# Patient Record
Sex: Male | Born: 1960 | Hispanic: No | Marital: Married | Smoking: Never smoker
Health system: Southern US, Community
[De-identification: ages and names within clinical notes are randomized; demographics above are authoritative.]

## PROBLEM LIST (undated history)

## (undated) DIAGNOSIS — I10 Essential (primary) hypertension: Secondary | ICD-10-CM

## (undated) DIAGNOSIS — E7800 Pure hypercholesterolemia, unspecified: Secondary | ICD-10-CM

## (undated) DIAGNOSIS — M549 Dorsalgia, unspecified: Secondary | ICD-10-CM

## (undated) DIAGNOSIS — G8929 Other chronic pain: Secondary | ICD-10-CM

## (undated) HISTORY — DX: Essential (primary) hypertension: I10

## (undated) HISTORY — PX: NO PAST SURGERIES: SHX2092

---

## 2007-06-26 ENCOUNTER — Ambulatory Visit: Payer: Self-pay | Admitting: *Deleted

## 2007-06-26 ENCOUNTER — Ambulatory Visit: Payer: Self-pay | Admitting: Internal Medicine

## 2007-12-15 ENCOUNTER — Ambulatory Visit: Payer: Self-pay | Admitting: Internal Medicine

## 2007-12-15 ENCOUNTER — Encounter (INDEPENDENT_AMBULATORY_CARE_PROVIDER_SITE_OTHER): Payer: Self-pay | Admitting: Adult Health

## 2007-12-15 LAB — CONVERTED CEMR LAB
Albumin: 4.4 g/dL (ref 3.5–5.2)
Alkaline Phosphatase: 96 units/L (ref 39–117)
BUN: 18 mg/dL (ref 6–23)
CO2: 20 meq/L (ref 19–32)
Cholesterol: 246 mg/dL — ABNORMAL HIGH (ref 0–200)
Eosinophils Absolute: 0.2 10*3/uL (ref 0.0–0.7)
Eosinophils Relative: 3 % (ref 0–5)
Glucose, Bld: 93 mg/dL (ref 70–99)
HCT: 45.6 % (ref 39.0–52.0)
HDL: 32 mg/dL — ABNORMAL LOW (ref >39)
Lymphocytes Relative: 32 % (ref 12–46)
Lymphs Abs: 2.8 10*3/uL (ref 0.7–4.0)
MCV: 84.3 fL (ref 78.0–100.0)
Monocytes Relative: 7 % (ref 3–12)
Neutrophils Relative %: 58 % (ref 43–77)
Platelets: 281 10*3/uL (ref 150–400)
Potassium: 3.9 meq/L (ref 3.5–5.3)
RBC: 5.41 M/uL (ref 4.22–5.81)
Sodium: 139 meq/L (ref 135–145)
Total Protein: 7.5 g/dL (ref 6.0–8.3)
Triglycerides: 674 mg/dL — ABNORMAL HIGH (ref <150)
WBC: 8.7 10*3/uL (ref 4.0–10.5)

## 2008-04-12 ENCOUNTER — Ambulatory Visit: Payer: Self-pay | Admitting: Internal Medicine

## 2008-04-12 ENCOUNTER — Encounter (INDEPENDENT_AMBULATORY_CARE_PROVIDER_SITE_OTHER): Payer: Self-pay | Admitting: Adult Health

## 2008-04-12 LAB — CONVERTED CEMR LAB
Albumin: 4.2 g/dL (ref 3.5–5.2)
Alkaline Phosphatase: 84 units/L (ref 39–117)
Basophils Absolute: 0.1 10*3/uL (ref 0.0–0.1)
CO2: 22 meq/L (ref 19–32)
Calcium: 9.3 mg/dL (ref 8.4–10.5)
Chloride: 107 meq/L (ref 96–112)
Eosinophils Relative: 2 % (ref 0–5)
Glucose, Bld: 107 mg/dL — ABNORMAL HIGH (ref 70–99)
HCT: 44.8 % (ref 39.0–52.0)
Hemoglobin: 15.3 g/dL (ref 13.0–17.0)
LDL Cholesterol: 147 mg/dL — ABNORMAL HIGH (ref 0–99)
Lymphocytes Relative: 35 % (ref 12–46)
Lymphs Abs: 2.7 10*3/uL (ref 0.7–4.0)
MCV: 84.1 fL (ref 78.0–100.0)
Monocytes Absolute: 0.6 10*3/uL (ref 0.1–1.0)
Monocytes Relative: 8 % (ref 3–12)
Neutro Abs: 4.2 10*3/uL (ref 1.7–7.7)
PSA: 2 ng/mL (ref 0.10–4.00)
Potassium: 4.2 meq/L (ref 3.5–5.3)
RBC: 5.33 M/uL (ref 4.22–5.81)
Sodium: 141 meq/L (ref 135–145)
Total Protein: 7.4 g/dL (ref 6.0–8.3)
Triglycerides: 174 mg/dL — ABNORMAL HIGH (ref <150)
Vit D, 25-Hydroxy: 12 ng/mL — ABNORMAL LOW (ref 30–89)
WBC: 7.7 10*3/uL (ref 4.0–10.5)

## 2008-04-24 ENCOUNTER — Encounter: Payer: Self-pay | Admitting: Internal Medicine

## 2008-04-24 ENCOUNTER — Ambulatory Visit: Payer: Self-pay | Admitting: Cardiology

## 2008-04-24 ENCOUNTER — Ambulatory Visit (HOSPITAL_COMMUNITY): Admission: RE | Admit: 2008-04-24 | Discharge: 2008-04-24 | Payer: Self-pay | Admitting: Internal Medicine

## 2008-06-20 ENCOUNTER — Ambulatory Visit: Payer: Self-pay | Admitting: Internal Medicine

## 2008-07-17 ENCOUNTER — Ambulatory Visit: Payer: Self-pay | Admitting: Internal Medicine

## 2008-10-04 ENCOUNTER — Ambulatory Visit: Payer: Self-pay | Admitting: Internal Medicine

## 2008-10-04 ENCOUNTER — Encounter (INDEPENDENT_AMBULATORY_CARE_PROVIDER_SITE_OTHER): Payer: Self-pay | Admitting: Adult Health

## 2008-10-04 LAB — CONVERTED CEMR LAB
AST: 16 units/L (ref 0–37)
Albumin: 4.4 g/dL (ref 3.5–5.2)
Alkaline Phosphatase: 82 units/L (ref 39–117)
Chloride: 107 meq/L (ref 96–112)
Glucose, Bld: 93 mg/dL (ref 70–99)
LDL Cholesterol: 147 mg/dL — ABNORMAL HIGH (ref 0–99)
Potassium: 4 meq/L (ref 3.5–5.3)
Sodium: 140 meq/L (ref 135–145)
Total Protein: 7.5 g/dL (ref 6.0–8.3)

## 2008-10-08 ENCOUNTER — Ambulatory Visit: Payer: Self-pay | Admitting: Internal Medicine

## 2008-12-25 ENCOUNTER — Encounter: Admission: RE | Admit: 2008-12-25 | Discharge: 2008-12-25 | Payer: Self-pay | Admitting: Infectious Diseases

## 2009-01-13 ENCOUNTER — Encounter (INDEPENDENT_AMBULATORY_CARE_PROVIDER_SITE_OTHER): Payer: Self-pay | Admitting: Adult Health

## 2009-01-13 ENCOUNTER — Ambulatory Visit: Payer: Self-pay | Admitting: Internal Medicine

## 2009-01-13 LAB — CONVERTED CEMR LAB
ALT: 13 units/L (ref 0–53)
Albumin: 4.9 g/dL (ref 3.5–5.2)
CO2: 22 meq/L (ref 19–32)
Calcium: 9.9 mg/dL (ref 8.4–10.5)
Chloride: 106 meq/L (ref 96–112)
Cholesterol: 209 mg/dL — ABNORMAL HIGH (ref 0–200)
Sodium: 140 meq/L (ref 135–145)
Total Protein: 7.9 g/dL (ref 6.0–8.3)

## 2009-04-02 ENCOUNTER — Encounter (INDEPENDENT_AMBULATORY_CARE_PROVIDER_SITE_OTHER): Payer: Self-pay | Admitting: Adult Health

## 2009-04-02 ENCOUNTER — Ambulatory Visit: Payer: Self-pay | Admitting: Internal Medicine

## 2009-04-02 LAB — CONVERTED CEMR LAB
AST: 17 units/L (ref 0–37)
Albumin: 4.5 g/dL (ref 3.5–5.2)
BUN: 15 mg/dL (ref 6–23)
Calcium: 9.7 mg/dL (ref 8.4–10.5)
Chloride: 104 meq/L (ref 96–112)
Glucose, Bld: 99 mg/dL (ref 70–99)
HDL: 45 mg/dL (ref >39)
Helicobacter Pylori Antibody-IgG: 0.7
Potassium: 4.1 meq/L (ref 3.5–5.3)
Triglycerides: 113 mg/dL (ref <150)

## 2009-06-03 ENCOUNTER — Ambulatory Visit: Payer: Self-pay | Admitting: Internal Medicine

## 2009-11-05 ENCOUNTER — Encounter (INDEPENDENT_AMBULATORY_CARE_PROVIDER_SITE_OTHER): Payer: Self-pay | Admitting: Internal Medicine

## 2009-11-05 LAB — CONVERTED CEMR LAB
HDL: 38 mg/dL — ABNORMAL LOW (ref >39)
Total CHOL/HDL Ratio: 5.3

## 2009-11-25 ENCOUNTER — Ambulatory Visit: Payer: Self-pay

## 2009-11-25 ENCOUNTER — Encounter: Payer: Self-pay | Admitting: Internal Medicine

## 2009-11-25 ENCOUNTER — Ambulatory Visit: Payer: Self-pay | Admitting: Cardiology

## 2009-11-25 ENCOUNTER — Ambulatory Visit (HOSPITAL_COMMUNITY): Admission: RE | Admit: 2009-11-25 | Discharge: 2009-11-25 | Payer: Self-pay | Admitting: Family Medicine

## 2009-11-25 ENCOUNTER — Encounter (INDEPENDENT_AMBULATORY_CARE_PROVIDER_SITE_OTHER): Payer: Self-pay | Admitting: Family Medicine

## 2009-11-25 ENCOUNTER — Encounter (INDEPENDENT_AMBULATORY_CARE_PROVIDER_SITE_OTHER): Payer: Self-pay

## 2010-03-10 NOTE — Miscellaneous (Signed)
Summary: IV for Dobutamine Echo  Clinical Lists Changes     IV 20 G (R) Hand for Dobutamine Echo. Patsy Edwards,RN.

## 2010-06-30 ENCOUNTER — Telehealth: Payer: Self-pay | Admitting: Cardiology

## 2010-06-30 NOTE — Telephone Encounter (Signed)
Faxed Echo. To [facility] ([phone]). This patient was referred patients from them.

## 2013-01-23 ENCOUNTER — Other Ambulatory Visit (HOSPITAL_COMMUNITY): Payer: Self-pay | Admitting: Internal Medicine

## 2013-01-23 ENCOUNTER — Ambulatory Visit (HOSPITAL_COMMUNITY)
Admission: RE | Admit: 2013-01-23 | Discharge: 2013-01-23 | Disposition: A | Discharge status: Home / Self Care | Payer: No Typology Code available for payment source | Source: Ambulatory Visit | Attending: Internal Medicine | Admitting: Internal Medicine

## 2013-01-23 DIAGNOSIS — M5137 Other intervertebral disc degeneration, lumbosacral region: Secondary | ICD-10-CM | POA: Insufficient documentation

## 2013-01-23 DIAGNOSIS — R52 Pain, unspecified: Secondary | ICD-10-CM

## 2013-01-23 DIAGNOSIS — R209 Unspecified disturbances of skin sensation: Secondary | ICD-10-CM | POA: Insufficient documentation

## 2013-01-23 DIAGNOSIS — M47817 Spondylosis without myelopathy or radiculopathy, lumbosacral region: Secondary | ICD-10-CM | POA: Insufficient documentation

## 2013-01-23 DIAGNOSIS — M51379 Other intervertebral disc degeneration, lumbosacral region without mention of lumbar back pain or lower extremity pain: Secondary | ICD-10-CM | POA: Insufficient documentation

## 2013-01-29 ENCOUNTER — Emergency Department (HOSPITAL_COMMUNITY)
Admission: EM | Admit: 2013-01-29 | Discharge: 2013-01-29 | Disposition: A | Discharge status: Home / Self Care | Payer: No Typology Code available for payment source | Source: Home / Self Care | Attending: Emergency Medicine | Admitting: Emergency Medicine

## 2013-01-29 ENCOUNTER — Encounter (HOSPITAL_COMMUNITY): Payer: Self-pay | Admitting: Emergency Medicine

## 2013-01-29 DIAGNOSIS — M5430 Sciatica, unspecified side: Secondary | ICD-10-CM

## 2013-01-29 DIAGNOSIS — M5432 Sciatica, left side: Secondary | ICD-10-CM

## 2013-01-29 HISTORY — DX: Dorsalgia, unspecified: M54.9

## 2013-01-29 HISTORY — DX: Pure hypercholesterolemia, unspecified: E78.00

## 2013-01-29 MED ORDER — METHOCARBAMOL 500 MG PO TABS: 500.0000 mg | ORAL_TABLET | Freq: Three times a day (TID) | ORAL | Status: DC

## 2013-01-29 MED ORDER — METHYLPREDNISOLONE ACETATE 80 MG/ML IJ SUSP
INTRAMUSCULAR | Status: AC
  Filled 2013-01-29: qty 1

## 2013-01-29 MED ORDER — METHYLPREDNISOLONE ACETATE 80 MG/ML IJ SUSP
80.0000 mg | Freq: Once | INTRAMUSCULAR | Status: AC
  Administered 2013-01-29: 80 mg via INTRAMUSCULAR

## 2013-01-29 MED ORDER — MELOXICAM 15 MG PO TABS: 15.0000 mg | ORAL_TABLET | Freq: Every day | ORAL | Status: DC

## 2013-01-29 MED ORDER — TRAMADOL HCL 50 MG PO TABS: 100.0000 mg | ORAL_TABLET | Freq: Three times a day (TID) | ORAL | Status: DC | PRN

## 2013-01-29 NOTE — ED Provider Notes (Signed)
Chief Complaint:   Chief Complaint  Patient presents with  . Leg Pain    History of Present Illness:   Zachary Hester is a 52 year old male who has had a two-week history of pain that begins in his left buttock and radiates down his entire leg into his foot. The pain is worse if he walks or sits. The leg has felt numb and tingly, but there's been no muscle weakness, bladder, or bowel dysfunction. No saddle anesthesia. He denies any fever, chills, or unintended weight loss. He has a history of degenerative disease in 2008. He was seen at the [facility] 4 days ago and had an x-ray of his back, but does not know the results. He was not given any treatment.  Review of Systems:  Other than noted above, the patient denies any of the following symptoms: Systemic:  No fever, chills, severe fatigue, or unexplained weight loss. GI:  No abdominal pain, nausea, vomiting, diarrhea, constipation, incontinence of bowel, or blood in stool. GU:  No dysuria, frequency, urgency, or hematuria. No incontinence of urine or difficulty urinating.  M-S:  No neck pain, joint pain, arthritis, or myalgias. Neuro:  No paresthesias, saddle anesthesia, muscular weakness, or progressive neurological deficit.  PMFSH:  Past medical history, family history, social history, meds, and allergies were reviewed. Specifically, there is no history of cancer, major trauma, osteoporosis, immunosuppression, or HIV infection. He has elevated cholesterol.  Physical Exam:   Vital signs:  BP 125/76  Pulse 79  Temp(Src) 97.5 F (36.4 C) (Oral)  Resp 16  SpO2 100% General:  Alert, oriented, in no distress. Abdomen:  Soft, non-tender.  No organomegaly or mass.  No pulsatile midline abdominal mass or bruit. Back:  Reveals no tenderness to palpation. His back has a fairly good range of motion, nearly full with some pain at the endpoints of movement. Straight leg raising was negative bilaterally. Neuro:  Normal  muscle strength, he reported diminished sensation over his left little toe to light touch, and knee and ankle reflexes were 0 bilaterally. Extremities: Pedal pulses were full, there was no edema. Skin:  Clear, warm and dry.  No rash.  Radiology:  Dg Lumbar Spine Complete  01/23/2013   CLINICAL DATA:  History of chronic back pain. History of injury from a fall several months ago. Numbness and tingling in left leg.  EXAM: LUMBAR SPINE - COMPLETE 4+ VIEW  COMPARISON:  None.  FINDINGS: There are 5 non-rib-bearing lumbar type vertebral bodies. SI joints appear intact. There is slight narrowing of the intervertebral disc space at the level of L5-S1. There is marginal osteophyte formation representing changes of degenerative spondylosis which is most severe at the level of L5-S1 with there is also some sclerosis. No fracture or bony destruction is seen. No subluxation or pars defects are evident.  IMPRESSION: Narrowing of intervertebral disc space at L5-S1. Changes of degenerative spondylosis most severe at the level of L5-S1.   Electronically Signed   By: Onalee Hua  Call M.D.   On: 01/23/2013 17:01    Course in Urgent Care Center:   Given Depo-Medrol 80 mg IM.  Assessment:  The encounter diagnosis was Sciatica, left.  He possibly has nerve root impingement from an HNP and we'll need to followup with orthopedics.  Plan:   1.  Meds:  The following meds were prescribed:   New Prescriptions   MELOXICAM (MOBIC) 15 MG TABLET    Take 1 tablet (15 mg total) by mouth daily.  METHOCARBAMOL (ROBAXIN) 500 MG TABLET    Take 1 tablet (500 mg total) by mouth 3 (three) times daily.   TRAMADOL (ULTRAM) 50 MG TABLET    Take 2 tablets (100 mg total) by mouth every 8 (eight) hours as needed.    2.  Patient Education/Counseling:  The patient was given appropriate handouts, self care instructions, and instructed in symptomatic relief. The patient was encouraged to try to be as active as possible and given some exercises to  do followed by moist heat.  3.  Follow up:  The patient was told to follow up if no better in 3 to 4 days, if becoming worse in any way, and given some red flag symptoms such as worsening pain or new neurological symptoms which would prompt immediate return.  Follow up with Dr. Isaias Cowman in 2 weeks.     [name], MD 01/29/13 [phone]

## 2013-01-29 NOTE — ED Notes (Signed)
Pt  Reports  Pain  Down  l  Leg    The  Pt  Reports  An old  inj  To the  l  Leg      10  Years  Ago      He  Ambulated  To  Room  With  A  Slow        Steady  Gait           He  Uses  A  Cane           He  denys  A  Recent  Injury

## 2013-02-14 ENCOUNTER — Emergency Department (INDEPENDENT_AMBULATORY_CARE_PROVIDER_SITE_OTHER)
Admission: EM | Admit: 2013-02-14 | Discharge: 2013-02-14 | Disposition: A | Discharge status: Home / Self Care | Payer: No Typology Code available for payment source | Source: Home / Self Care | Attending: Emergency Medicine | Admitting: Emergency Medicine

## 2013-02-14 ENCOUNTER — Encounter (HOSPITAL_COMMUNITY): Payer: Self-pay | Admitting: Emergency Medicine

## 2013-02-14 DIAGNOSIS — M5430 Sciatica, unspecified side: Secondary | ICD-10-CM

## 2013-02-14 DIAGNOSIS — M5432 Sciatica, left side: Secondary | ICD-10-CM

## 2013-02-14 MED ORDER — MELOXICAM 7.5 MG PO TABS: 7.5000 mg | ORAL_TABLET | Freq: Every day | ORAL | Status: DC

## 2013-02-14 NOTE — ED Provider Notes (Signed)
CSN: [id number]     Arrival date & time 02/14/13  1000 History   First MD Initiated Contact with Patient 02/14/13 1135     Chief Complaint  Patient presents with  . Leg Pain   (Consider location/radiation/quality/duration/timing/severity/associated sxs/prior Treatment) HPI Comments: Patient is here to discuss chronic left sided sciatica. This condition has been present since 2008. [facility] and prescribed mobic and tramadol. States mobic was helpful and tramadol made him nauseous, so he has not used this medication very often. Once he ran out of medication, symptoms returned, so he contacted the ortho referral listed on his paperwork for evaluation and was informed that he could not be seen because "they did not accept his orange card." So [facility] medication and additional referral.   The history is provided by the patient.    Past Medical History  Diagnosis Date  . Back pain   . Hypercholesteremia    History reviewed. No pertinent past surgical history. History reviewed. No pertinent family history. History  Substance Use Topics  . Smoking status: Never Smoker   . Smokeless tobacco: Not on file  . Alcohol Use: No    Review of Systems  All other systems reviewed and are negative.    Allergies  Review of patient's allergies indicates no known allergies.  Home Medications   Current Outpatient Rx  Name  Route  Sig  Dispense  Refill  . meloxicam (MOBIC) 7.5 MG tablet   Oral   Take 1 tablet (7.5 mg total) by mouth daily.   30 tablet   0   . traMADol (ULTRAM) 50 MG tablet   Oral   Take 2 tablets (100 mg total) by mouth every 8 (eight) hours as needed.   30 tablet   0    BP 140/85  Pulse 100  Temp(Src) 97.8 F (36.6 C) (Oral)  Resp 18  SpO2 100% Physical Exam  Nursing note and vitals reviewed. Constitutional: He is oriented to person, place, and time. He appears well-developed and well-nourished.  No distress.  HENT:  Head: Normocephalic and atraumatic.  Eyes: Conjunctivae are normal.  Neck: Normal range of motion. Neck supple.  Cardiovascular: Normal rate, regular rhythm and normal heart sounds.   Pulmonary/Chest: Effort normal and breath sounds normal. No respiratory distress. He has no wheezes.  Abdominal: Soft. There is no tenderness.  Musculoskeletal: Normal range of motion.       Legs: Neurological: He is alert and oriented to person, place, and time. He has normal strength and normal reflexes. No sensory deficit. Coordination and gait normal.  Skin: Skin is warm and dry. No rash noted.  Psychiatric: He has a normal mood and affect. His behavior is normal.    ED Course  Procedures (including critical care time) Labs Review Labs Reviewed - No data to display Imaging Review No results found.  EKG Interpretation    Date/Time:    Ventricular Rate:    PR Interval:    QRS Duration:   QT Interval:    QTC Calculation:   R Axis:     Text Interpretation:              MDM   1. Chronic sciatica of left side    Spent several minutes explaining to the patient that because of his health insurance coverage, a specialist referral needed to be arranged and approved through his PCP office. He states that he has made  an appointment with his PCP for 02/26/2013, but he does not want to wait until then. I explained to his that based upon his normal examination tonight, he would not require emergent or even urgent referral and I would be glad to provide him with additional Rx for mobic until he can see his PCP and specialist referral/evaluation can be arranged.     Jess Barters [place], [place] 02/14/13 1902

## 2013-02-14 NOTE — Discharge Instructions (Signed)
As we discussed, because of the type of health insurance coverage you have, all referrals to a specialist provider need to be arranged and approved by your primary care office [facility]). Please Use Mobic 7.5 mg once a day as prescribed until you are able to follow up with your primary care office on 02/26/2013.

## 2013-02-14 NOTE — ED Notes (Signed)
Pt  Reports  Flair  Up  Of  Pain  Down l  Leg  From   Old  Injury        Seen  sev  Weeks  Ago ucc  For  Similar

## 2013-02-17 NOTE — ED Provider Notes (Signed)
Medical screening examination/treatment/procedure(s) were performed by a resident physician or non-physician practitioner and as the supervising physician I was immediately available for consultation/collaboration.  Evan Corey, MD    Evan S Corey, MD 02/17/13 0848 

## 2013-02-26 ENCOUNTER — Ambulatory Visit: Payer: Self-pay | Admitting: Internal Medicine

## 2013-03-22 ENCOUNTER — Ambulatory Visit: Payer: No Typology Code available for payment source | Attending: Internal Medicine | Admitting: Physical Therapy

## 2013-03-22 DIAGNOSIS — R262 Difficulty in walking, not elsewhere classified: Secondary | ICD-10-CM | POA: Insufficient documentation

## 2013-03-22 DIAGNOSIS — E7800 Pure hypercholesterolemia, unspecified: Secondary | ICD-10-CM | POA: Insufficient documentation

## 2013-03-22 DIAGNOSIS — IMO0001 Reserved for inherently not codable concepts without codable children: Secondary | ICD-10-CM | POA: Insufficient documentation

## 2013-03-22 DIAGNOSIS — M545 Low back pain, unspecified: Secondary | ICD-10-CM | POA: Insufficient documentation

## 2013-03-30 ENCOUNTER — Ambulatory Visit: Payer: No Typology Code available for payment source | Admitting: Physical Therapy

## 2013-04-05 ENCOUNTER — Ambulatory Visit: Payer: No Typology Code available for payment source | Admitting: Physical Therapy

## 2013-04-16 ENCOUNTER — Ambulatory Visit: Payer: No Typology Code available for payment source | Attending: Internal Medicine | Admitting: Physical Therapy

## 2013-04-16 DIAGNOSIS — E7800 Pure hypercholesterolemia, unspecified: Secondary | ICD-10-CM | POA: Insufficient documentation

## 2013-04-16 DIAGNOSIS — R262 Difficulty in walking, not elsewhere classified: Secondary | ICD-10-CM | POA: Insufficient documentation

## 2013-04-16 DIAGNOSIS — M545 Low back pain, unspecified: Secondary | ICD-10-CM | POA: Insufficient documentation

## 2013-04-16 DIAGNOSIS — IMO0001 Reserved for inherently not codable concepts without codable children: Secondary | ICD-10-CM | POA: Insufficient documentation

## 2013-04-19 ENCOUNTER — Ambulatory Visit: Payer: No Typology Code available for payment source | Admitting: Physical Therapy

## 2013-04-23 ENCOUNTER — Ambulatory Visit: Payer: No Typology Code available for payment source | Admitting: Physical Therapy

## 2013-04-26 ENCOUNTER — Ambulatory Visit: Payer: No Typology Code available for payment source | Admitting: Physical Therapy

## 2013-04-30 ENCOUNTER — Ambulatory Visit: Payer: No Typology Code available for payment source | Admitting: Physical Therapy

## 2013-05-03 ENCOUNTER — Ambulatory Visit: Payer: No Typology Code available for payment source | Admitting: Physical Therapy

## 2013-05-07 ENCOUNTER — Ambulatory Visit: Payer: No Typology Code available for payment source | Admitting: Physical Therapy

## 2013-05-14 ENCOUNTER — Ambulatory Visit: Payer: No Typology Code available for payment source | Attending: Internal Medicine | Admitting: Physical Therapy

## 2013-05-14 DIAGNOSIS — M545 Low back pain, unspecified: Secondary | ICD-10-CM | POA: Insufficient documentation

## 2013-05-14 DIAGNOSIS — R262 Difficulty in walking, not elsewhere classified: Secondary | ICD-10-CM | POA: Insufficient documentation

## 2013-05-14 DIAGNOSIS — IMO0001 Reserved for inherently not codable concepts without codable children: Secondary | ICD-10-CM | POA: Insufficient documentation

## 2013-05-14 DIAGNOSIS — E7800 Pure hypercholesterolemia, unspecified: Secondary | ICD-10-CM | POA: Insufficient documentation

## 2013-05-17 ENCOUNTER — Ambulatory Visit: Payer: No Typology Code available for payment source | Admitting: Physical Therapy

## 2013-05-21 ENCOUNTER — Ambulatory Visit: Payer: No Typology Code available for payment source | Admitting: Physical Therapy

## 2013-05-24 ENCOUNTER — Ambulatory Visit: Payer: No Typology Code available for payment source | Admitting: Physical Therapy

## 2013-05-28 ENCOUNTER — Ambulatory Visit: Payer: No Typology Code available for payment source | Admitting: Physical Therapy

## 2013-05-31 ENCOUNTER — Ambulatory Visit: Payer: No Typology Code available for payment source | Admitting: Physical Therapy

## 2013-06-04 ENCOUNTER — Ambulatory Visit: Payer: No Typology Code available for payment source | Admitting: Physical Therapy

## 2013-06-07 ENCOUNTER — Ambulatory Visit: Payer: No Typology Code available for payment source | Admitting: Physical Therapy

## 2013-06-11 ENCOUNTER — Ambulatory Visit: Payer: No Typology Code available for payment source | Attending: Internal Medicine | Admitting: Physical Therapy

## 2013-06-11 DIAGNOSIS — E7800 Pure hypercholesterolemia, unspecified: Secondary | ICD-10-CM | POA: Insufficient documentation

## 2013-06-11 DIAGNOSIS — M545 Low back pain, unspecified: Secondary | ICD-10-CM | POA: Insufficient documentation

## 2013-06-11 DIAGNOSIS — R262 Difficulty in walking, not elsewhere classified: Secondary | ICD-10-CM | POA: Insufficient documentation

## 2013-06-11 DIAGNOSIS — IMO0001 Reserved for inherently not codable concepts without codable children: Secondary | ICD-10-CM | POA: Insufficient documentation

## 2013-06-14 ENCOUNTER — Ambulatory Visit: Payer: No Typology Code available for payment source | Admitting: Physical Therapy

## 2013-06-18 ENCOUNTER — Ambulatory Visit: Payer: No Typology Code available for payment source | Admitting: Physical Therapy

## 2014-01-24 ENCOUNTER — Emergency Department (HOSPITAL_COMMUNITY): Payer: No Typology Code available for payment source

## 2014-01-24 ENCOUNTER — Encounter (HOSPITAL_COMMUNITY): Payer: Self-pay | Admitting: Emergency Medicine

## 2014-01-24 ENCOUNTER — Emergency Department (HOSPITAL_COMMUNITY)
Admission: EM | Admit: 2014-01-24 | Discharge: 2014-01-24 | Disposition: A | Discharge status: Home / Self Care | Payer: Self-pay | Attending: Emergency Medicine | Admitting: Emergency Medicine

## 2014-01-24 DIAGNOSIS — K529 Noninfective gastroenteritis and colitis, unspecified: Secondary | ICD-10-CM | POA: Insufficient documentation

## 2014-01-24 DIAGNOSIS — Z791 Long term (current) use of non-steroidal anti-inflammatories (NSAID): Secondary | ICD-10-CM | POA: Insufficient documentation

## 2014-01-24 DIAGNOSIS — E780 Pure hypercholesterolemia: Secondary | ICD-10-CM | POA: Insufficient documentation

## 2014-01-24 DIAGNOSIS — Z79899 Other long term (current) drug therapy: Secondary | ICD-10-CM | POA: Insufficient documentation

## 2014-01-24 LAB — COMPREHENSIVE METABOLIC PANEL
ALT: 15 U/L (ref 0–53)
AST: 17 U/L (ref 0–37)
Albumin: 3.9 g/dL (ref 3.5–5.2)
Alkaline Phosphatase: 101 U/L (ref 39–117)
Anion gap: 10 (ref 5–15)
BUN: 11 mg/dL (ref 6–23)
CO2: 24 mEq/L (ref 19–32)
Calcium: 9.2 mg/dL (ref 8.4–10.5)
Chloride: 105 mEq/L (ref 96–112)
Creatinine, Ser: 0.7 mg/dL (ref 0.50–1.35)
GFR calc Af Amer: >90 mL/min (ref >90)
GFR calc non Af Amer: >90 mL/min (ref >90)
Glucose, Bld: 108 mg/dL — ABNORMAL HIGH (ref 70–99)
Potassium: 4.2 mEq/L (ref 3.7–5.3)
Sodium: 139 mEq/L (ref 137–147)
Total Bilirubin: 0.4 mg/dL (ref 0.3–1.2)
Total Protein: 7.4 g/dL (ref 6.0–8.3)

## 2014-01-24 LAB — CBC WITH DIFFERENTIAL/PLATELET
Basophils Absolute: 0.1 10*3/uL (ref 0.0–0.1)
Basophils Relative: 1 % (ref 0–1)
Eosinophils Absolute: 0.2 10*3/uL (ref 0.0–0.7)
Eosinophils Relative: 3 % (ref 0–5)
HCT: 44.8 % (ref 39.0–52.0)
Hemoglobin: 15.4 g/dL (ref 13.0–17.0)
Lymphocytes Relative: 30 % (ref 12–46)
Lymphs Abs: 1.6 10*3/uL (ref 0.7–4.0)
MCH: 28.6 pg (ref 26.0–34.0)
MCHC: 34.4 g/dL (ref 30.0–36.0)
MCV: 83.3 fL (ref 78.0–100.0)
Monocytes Absolute: 0.4 10*3/uL (ref 0.1–1.0)
Monocytes Relative: 7 % (ref 3–12)
Neutro Abs: 3.2 10*3/uL (ref 1.7–7.7)
Neutrophils Relative %: 59 % (ref 43–77)
Platelets: 228 10*3/uL (ref 150–400)
RBC: 5.38 MIL/uL (ref 4.22–5.81)
RDW: 12.9 % (ref 11.5–15.5)
WBC: 5.3 10*3/uL (ref 4.0–10.5)

## 2014-01-24 LAB — URINALYSIS, ROUTINE W REFLEX MICROSCOPIC
Bilirubin Urine: NEGATIVE
Glucose, UA: NEGATIVE mg/dL
Hgb urine dipstick: NEGATIVE
Ketones, ur: NEGATIVE mg/dL
Nitrite: NEGATIVE
Protein, ur: NEGATIVE mg/dL
Specific Gravity, Urine: 1.023 (ref 1.005–1.030)
Urobilinogen, UA: 0.2 mg/dL (ref 0.0–1.0)
pH: 6.5 (ref 5.0–8.0)

## 2014-01-24 LAB — LIPASE, BLOOD: Lipase: 18 U/L (ref 11–59)

## 2014-01-24 LAB — URINE MICROSCOPIC-ADD ON

## 2014-01-24 MED ORDER — SODIUM CHLORIDE 0.9 % IV BOLUS (SEPSIS)
1000.0000 mL | Freq: Once | INTRAVENOUS | Status: AC
  Administered 2014-01-24: 1000 mL via INTRAVENOUS

## 2014-01-24 MED ORDER — MORPHINE SULFATE 4 MG/ML IJ SOLN
6.0000 mg | Freq: Once | INTRAMUSCULAR | Status: AC
  Administered 2014-01-24: 6 mg via INTRAVENOUS
  Filled 2014-01-24: qty 2

## 2014-01-24 MED ORDER — HYDROCODONE-ACETAMINOPHEN 5-325 MG PO TABS: 1.0000 | ORAL_TABLET | Freq: Four times a day (QID) | ORAL | Status: DC | PRN

## 2014-01-24 MED ORDER — PREDNISONE 50 MG PO TABS: 50.0000 mg | ORAL_TABLET | Freq: Every day | ORAL | Status: DC

## 2014-01-24 MED ORDER — IOHEXOL 300 MG/ML  SOLN
25.0000 mL | Freq: Once | INTRAMUSCULAR | Status: AC | PRN
  Administered 2014-01-24: 25 mL via ORAL

## 2014-01-24 MED ORDER — METRONIDAZOLE 500 MG PO TABS: 500.0000 mg | ORAL_TABLET | Freq: Two times a day (BID) | ORAL | Status: DC

## 2014-01-24 MED ORDER — IOHEXOL 300 MG/ML  SOLN
100.0000 mL | Freq: Once | INTRAMUSCULAR | Status: AC | PRN
  Administered 2014-01-24: 100 mL via INTRAVENOUS

## 2014-01-24 MED ORDER — CIPROFLOXACIN HCL 500 MG PO TABS: 500.0000 mg | ORAL_TABLET | Freq: Two times a day (BID) | ORAL | Status: DC

## 2014-01-24 NOTE — Discharge Instructions (Signed)
Return here as needed.  Follow up with GI specialist provided.  Increase your fluid intake, rest as much as possible

## 2014-01-24 NOTE — ED Provider Notes (Signed)
CSN: [id number]     Arrival date & time 01/24/14  0840 History   First MD Initiated Contact with Patient 01/24/14 [phone]     Chief Complaint  Patient presents with  . Headache  . Abdominal Pain  . eye symptoms      (Consider location/radiation/quality/duration/timing/severity/associated sxs/prior Treatment) HPI Patient presents to the emergency department with abdominal pain that started 1 week ago.  The patient states the pain has gotten worse.  She is also complaining of left neck pain and intermittent sharp stabbing pains in his left chest.  The patient states that he took Ultram without relief of his symptoms.  The patient states abdominal pain has increased over this timeframe.  Patient denies nausea, vomiting, shortness of breath, diarrhea, weakness, dizziness, back pain, fever, cough, runny nose, sore throat, lightheadedness, near syncope, rash, or syncope.  The patient states that his appetite has not been affected by the abdominal pain.  The patient states that nothing seems make his condition, better with palpation makes the pain worse.  The patient states that he attempted to follow-up with his primary care doctor is unable to get an appointment Past Medical History  Diagnosis Date  . Back pain   . Hypercholesteremia    History reviewed. No pertinent past surgical history. No family history on file. History  Substance Use Topics  . Smoking status: Never Smoker   . Smokeless tobacco: Not on file  . Alcohol Use: No    Review of Systems  All other systems negative except as documented in the HPI. All pertinent positives and negatives as reviewed in the HPI.  Allergies  Review of patient's allergies indicates no known allergies.  Home Medications   Prior to Admission medications   Medication Sig Start Date End Date Taking? Authorizing Provider  atorvastatin (LIPITOR) 20 MG tablet Take 20 mg by mouth daily.   Yes Historical Provider, MD  ibuprofen (ADVIL,MOTRIN) 400 MG  tablet Take 400 mg by mouth every 6 (six) hours as needed for headache or mild pain.   Yes Historical Provider, MD  traMADol (ULTRAM) 50 MG tablet Take 2 tablets (100 mg total) by mouth every 8 (eight) hours as needed. 01/29/13  Yes Reuben Likesavid C Keller, MD  meloxicam (MOBIC) 7.5 MG tablet Take 1 tablet (7.5 mg total) by mouth daily. Patient not taking: Reported on 01/24/2014 02/14/13   Jess BartersJennifer Lee H Presson, PA   BP 145/91 mmHg  Pulse 69  Temp(Src) 97.8 F (36.6 C) (Oral)  Resp 20  Ht 6\' 3"  (1.905 m)  Wt 196 lb (88.905 kg)  BMI 24.50 kg/m2  SpO2 100% Physical Exam  Constitutional: He is oriented to person, place, and time. He appears well-developed and well-nourished. No distress.  HENT:  Head: Normocephalic and atraumatic.  Mouth/Throat: Oropharynx is clear and moist.  Eyes: Pupils are equal, round, and reactive to light.  Neck: Normal range of motion. Neck supple.  Cardiovascular: Normal rate, regular rhythm and normal heart sounds.  Exam reveals no gallop and no friction rub.   No murmur heard. Pulmonary/Chest: Effort normal and breath sounds normal. No respiratory distress.  Abdominal: Soft. Bowel sounds are normal. He exhibits no distension. There is tenderness. There is no rebound and no guarding.  Musculoskeletal: He exhibits no edema.  Neurological: He is alert and oriented to person, place, and time. He exhibits normal muscle tone. Coordination normal.  Skin: Skin is warm and dry. No rash noted. No erythema.  Psychiatric: He has a normal mood and affect.  His behavior is normal.  Nursing note and vitals reviewed.   ED Course  Procedures (including critical care time) Labs Review Labs Reviewed  COMPREHENSIVE METABOLIC PANEL - Abnormal; Notable for the following:    Glucose, Bld 108 (*)    All other components within normal limits  URINE CULTURE  LIPASE, BLOOD  CBC WITH DIFFERENTIAL  URINALYSIS, ROUTINE W REFLEX MICROSCOPIC    Imaging Review Ct Abdomen Pelvis W  Contrast  01/24/2014   CLINICAL DATA:  Lower abdominal pain  EXAM: CT ABDOMEN AND PELVIS WITH CONTRAST  TECHNIQUE: Multidetector CT imaging of the abdomen and pelvis was performed using the standard protocol following bolus administration of intravenous contrast. Oral contrast was also administered.  CONTRAST:  100mL OMNIPAQUE IOHEXOL 300 MG/ML  SOLN  COMPARISON:  None.  FINDINGS: Lung bases are clear.  There is a 5 mm cyst in the dome of the liver on the right. No other focal liver lesion identified. There is some fatty infiltration near the fissure for the ligamentum teres. Gallbladder wall is not thickened. There is no biliary duct dilatation.  Spleen, pancreas, and adrenals appear normal. Kidneys bilaterally show no mass or hydronephrosis on either side. There are 2 renal arteries on the left, both widely patent. There is a single renal artery on the right, patent. There is no renal or ureteral calculus on either side.  In the pelvis, the urinary bladder is midline with normal wall thickness. There is no appreciable pelvic mass or pelvic fluid collection. The appendix appears normal.  There is moderate stool in the colon. There is a short segment of slight wall thickening in the mid sigmoid colon. No diverticular changes noted in this region. There is no bowel obstruction. No free air or portal venous air. There is no appreciable ascites, adenopathy, or abscess in the abdomen or pelvis. There is no abdominal aortic aneurysm. There is degenerative change in the lumbar spine. 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 Somewhat similar changes are present at L5-S1. There is extensive disc degeneration at L4-5 and L5-S1. No blastic or lytic bone lesions are appreciated.  IMPRESSION: Suspect a degree of focal colitis in the mid sigmoid region. Specifically, there is a short segment of mild wall thickening in this area. There is  subtle hyperemia in this area as well. There is no surrounding mesenteric thickening, and no diverticulitis.  No bowel obstruction.  No abscess.  Appendix appears normal.  No renal or ureteral calculus.  No hydronephrosis.  Spinal stenosis at L4-5 and to a slightly lesser degree at L5-S1, multifactorial.  Fatty infiltration in liver.   Electronically Signed   By: Bretta BangWilliam  Woodruff M.D.   On: 01/24/2014 13:01    The patient is advised of the results and he will be advised follow-up with the GI Dr. provided.  The patient is advised to increase his fluid intake and rest as much as possible.  Told to return here as needed    MDM   Final diagnoses:  None     Carlyle DollyChristopher W Chyler Creely, PA-C 01/24/14 1448  Mirian MoMatthew Gentry, MD 01/28/14 [phone]

## 2014-01-24 NOTE — ED Notes (Signed)
Patient states he started having L sided headache yesterday at 0700.   Patient states L eye red, with pain also.  Denies vision changes.  Patient also has lower abdominal pain but denies N/V/D.

## 2014-01-24 NOTE — ED Notes (Signed)
Pt reports intermittent chest pain daily accompanied with sweating and generalized weakness.

## 2014-01-24 NOTE — ED Notes (Signed)
Pt comfortable with discharge and follow up instructions. Pt declines wheelchair, escorted to waiting area by this RN. 

## 2014-01-25 LAB — URINE CULTURE
Colony Count: NO GROWTH
Culture: NO GROWTH

## 2014-01-25 NOTE — ED Notes (Signed)
This RN witnessed Marga HootsNicole Stephens RN waste 2mg /0.305mL Morphine into the sharps container.

## 2014-09-24 ENCOUNTER — Ambulatory Visit: Payer: No Typology Code available for payment source | Admitting: Family Medicine

## 2014-11-10 ENCOUNTER — Encounter (HOSPITAL_COMMUNITY): Payer: Self-pay | Admitting: *Deleted

## 2014-11-10 ENCOUNTER — Emergency Department (HOSPITAL_COMMUNITY)
Admission: EM | Admit: 2014-11-10 | Discharge: 2014-11-10 | Disposition: A | Discharge status: Home / Self Care | Payer: No Typology Code available for payment source | Attending: Emergency Medicine | Admitting: Emergency Medicine

## 2014-11-10 DIAGNOSIS — G8929 Other chronic pain: Secondary | ICD-10-CM | POA: Insufficient documentation

## 2014-11-10 DIAGNOSIS — M549 Dorsalgia, unspecified: Secondary | ICD-10-CM

## 2014-11-10 DIAGNOSIS — R51 Headache: Secondary | ICD-10-CM | POA: Insufficient documentation

## 2014-11-10 DIAGNOSIS — Z79899 Other long term (current) drug therapy: Secondary | ICD-10-CM | POA: Insufficient documentation

## 2014-11-10 DIAGNOSIS — E7800 Pure hypercholesterolemia, unspecified: Secondary | ICD-10-CM | POA: Insufficient documentation

## 2014-11-10 DIAGNOSIS — R21 Rash and other nonspecific skin eruption: Secondary | ICD-10-CM | POA: Insufficient documentation

## 2014-11-10 HISTORY — DX: Dorsalgia, unspecified: M54.9

## 2014-11-10 HISTORY — DX: Other chronic pain: G89.29

## 2014-11-10 MED ORDER — FAMOTIDINE 20 MG PO TABS
20.0000 mg | ORAL_TABLET | Freq: Once | ORAL | Status: AC
Start: 2014-11-10 — End: 2014-11-10
  Administered 2014-11-10: 20 mg via ORAL
  Filled 2014-11-10: qty 1

## 2014-11-10 MED ORDER — PREDNISONE 20 MG PO TABS
60.0000 mg | ORAL_TABLET | Freq: Once | ORAL | Status: AC
  Administered 2014-11-10: 60 mg via ORAL
  Filled 2014-11-10: qty 3

## 2014-11-10 MED ORDER — TRIAMCINOLONE ACETONIDE 0.025 % EX OINT: 1.0000 "application " | TOPICAL_OINTMENT | Freq: Two times a day (BID) | CUTANEOUS | Status: DC

## 2014-11-10 MED ORDER — DIPHENHYDRAMINE HCL 25 MG PO CAPS
25.0000 mg | ORAL_CAPSULE | Freq: Once | ORAL | Status: AC
  Administered 2014-11-10: 25 mg via ORAL
  Filled 2014-11-10: qty 1

## 2014-11-10 MED ORDER — PREDNISONE 10 MG (21) PO TBPK: 10.0000 mg | ORAL_TABLET | Freq: Every day | ORAL | Status: DC

## 2014-11-10 MED ORDER — HYDROXYZINE HCL 25 MG PO TABS: 25.0000 mg | ORAL_TABLET | Freq: Four times a day (QID) | ORAL | Status: DC

## 2014-11-10 MED ORDER — METOCLOPRAMIDE HCL 10 MG PO TABS
10.0000 mg | ORAL_TABLET | Freq: Once | ORAL | Status: AC
  Administered 2014-11-10: 10 mg via ORAL
  Filled 2014-11-10: qty 1

## 2014-11-10 NOTE — ED Notes (Signed)
PT reports rash wuth itching to BIL. Arms and groin area.

## 2014-11-10 NOTE — Discharge Instructions (Signed)

## 2014-11-10 NOTE — ED Notes (Signed)
Declined W/C at D/C and was escorted to lobby by RN. 

## 2014-11-10 NOTE — ED Provider Notes (Signed)
CSN: [id number]     Arrival date & time 11/10/14  4098 History  By signing my name below, I, Zachary Hester, attest that this documentation has been prepared under the direction and in the presence of  Zachary Berry, PA-C. Electronically Signed: [name], ED Scribe. 11/10/2014. 10:42 AM.   Chief Complaint  Patient presents with  . Rash   The history is provided by the patient. No language interpreter was used.   HPI Comments: Zachary Hester is a 54 y.o. male who presents to the Emergency Department complaining of a constant, pruritic rash to ventral aspect of BUE and left ear that has been present for 1 week with pruritic qualitiy progressively worsening last night. Rash is worse on right forearm. He has been taking 2 benadryl q2h with no relief; last dose was 6 hours ago. Pt reports a headache last night and this morning for which he took tramadol with mild relief. Denies recent changes to medication, new soaps or detergent, new foods, wheezing, sore throat, SOB, trouble swallowing. Also denies a PMhx of asthma, eczema, or similar rashes, fevers or chills.   Past Medical History  Diagnosis Date  . Back pain   . Hypercholesteremia   . Chronic back pain    History reviewed. No pertinent past surgical history. History reviewed. No pertinent family history. Social History  Substance Use Topics  . Smoking status: Never Smoker   . Smokeless tobacco: None  . Alcohol Use: No    Review of Systems  Constitutional: Negative.  Negative for fever and chills.  HENT: Negative for facial swelling, sore throat, trouble swallowing and voice change.   Respiratory: Negative for cough, choking, chest tightness, shortness of breath, wheezing and stridor.   Cardiovascular: Negative for chest pain.  Gastrointestinal: Negative for nausea, vomiting and abdominal pain.  Musculoskeletal: Negative for myalgias and arthralgias.  Skin: Positive for rash.  Neurological: Positive for headaches.    Allergies  Review of patient's allergies indicates no known allergies.  Home Medications   Prior to Admission medications   Medication Sig Start Date End Date Taking? Authorizing Provider  atorvastatin (LIPITOR) 20 MG tablet Take 20 mg by mouth daily.    Historical Provider, MD  ciprofloxacin (CIPRO) 500 MG tablet Take 1 tablet (500 mg total) by mouth 2 (two) times daily. 01/24/14   Charlestine Night, PA-C  HYDROcodone-acetaminophen (NORCO/VICODIN) 5-325 MG per tablet Take 1 tablet by mouth every 6 (six) hours as needed for moderate pain. 01/24/14   Charlestine Night, PA-C  hydrOXYzine (ATARAX/VISTARIL) 25 MG tablet Take 1 tablet (25 mg total) by mouth every 6 (six) hours. 11/10/14   Zachary Berry, PA-C  ibuprofen (ADVIL,MOTRIN) 400 MG tablet Take 400 mg by mouth every 6 (six) hours as needed for headache or mild pain.    Historical Provider, MD  meloxicam (MOBIC) 7.5 MG tablet Take 1 tablet (7.5 mg total) by mouth daily. Patient not taking: Reported on 01/24/2014 02/14/13   [name], PA  metroNIDAZOLE (FLAGYL) 500 MG tablet Take 1 tablet (500 mg total) by mouth 2 (two) times daily. 01/24/14   Charlestine Night, PA-C  predniSONE (STERAPRED UNI-PAK 21 TAB) 10 MG (21) TBPK tablet Take 1 tablet (10 mg total) by mouth daily. Take 6 tabs by mouth daily  for 2 days, then 5 tabs for 2 days, then 4 tabs for 2 days, then 3 tabs for 2 days, 2 tabs for 2 days, then 1 tab by mouth daily for 2 days 11/10/14  Zachary Berry, PA-C  traMADol (ULTRAM) 50 MG tablet Take 2 tablets (100 mg total) by mouth every 8 (eight) hours as needed. 01/29/13   [name], MD  triamcinolone (KENALOG) 0.025 % ointment Apply 1 application topically 2 (two) times daily. 11/10/14   Zachary Berry, PA-C   BP 136/86 mmHg  Pulse 94  Temp(Src) 98 F (36.7 C) (Oral)  Resp 18  Ht  (1.88 m)  Wt 206 lb (93.441 kg)  BMI 26.44 kg/m2  SpO2 100% Physical Exam  Constitutional: He is oriented to person, place, and time.  He appears well-developed and well-nourished. No distress.  HENT:  Head: Normocephalic.  Right Ear: External ear normal.  Left Ear: External ear normal.  Mouth/Throat: Oropharynx is clear and moist. No oropharyngeal exudate.  Eyes: Conjunctivae are normal. Right eye exhibits no discharge. Left eye exhibits no discharge.  Neck: Normal range of motion. Neck supple.  Cardiovascular: Normal rate, regular rhythm and normal heart sounds.  Exam reveals no gallop and no friction rub.   No murmur heard. Pulmonary/Chest: Effort normal and breath sounds normal. No respiratory distress. He has no wheezes. He has no rales.  Musculoskeletal: Normal range of motion.  Neurological: He is alert and oriented to person, place, and time. Coordination normal.  Skin: Skin is warm. Rash noted. There is erythema.  Diffuse macular, papular rash with erythema that is worse to [facility] joint on right forearm and along forearms with excoriations; thick areas behind bilateral ears that are dry along pinna.   Psychiatric: He has a normal mood and affect. His behavior is normal.  Nursing note and vitals reviewed.   ED Course  Procedures  DIAGNOSTIC STUDIES: Oxygen Saturation is 100% on RA, normal by my interpretation.    COORDINATION OF CARE: 10:40 AM Discussed treatment plan with pt at bedside and pt agreed to plan.   MDM   Final diagnoses:  Rash and nonspecific skin eruption    Rash consistent with atopic dermatitis. Patient denies any difficulty breathing or swallowing.  Pt has a patent airway without stridor and is handling secretions without difficulty; no angioedema. No blisters, no pustules, no warmth, no draining sinus tracts, no superficial abscesses, no bullous impetigo, no vesicles, no desquamation, no target lesions with dusky purpura or a central bulla. Not tender to touch. No concern for superimposed infection. No concern for SJS, TEN, TSS, tick borne illness, syphilis or other life-threatening condition.  Pepcid, benadryl and prednisone given in ED.  Will discharge home with short course of steroids, hydroxyzine as needed for pruritis, and topical steroid given as well.    I personally performed the services described in this documentation, which was scribed in my presence. The recorded information has been reviewed and is accurate.    Zachary Berry, PA-C 11/12/14 1129  Gwyneth Sprout, MD 11/14/14 [phone]

## 2014-11-18 ENCOUNTER — Encounter: Payer: Self-pay | Admitting: Gastroenterology

## 2015-01-07 ENCOUNTER — Encounter: Payer: Self-pay | Admitting: Nurse Practitioner

## 2015-01-20 ENCOUNTER — Encounter: Payer: No Typology Code available for payment source | Admitting: Gastroenterology

## 2015-11-25 IMAGING — CT CT ABD-PELV W/ CM
2 of 5 series · 15 of 46 positions shown, 17 images · IV contrast (omnipaque)
Comparison: None.

CLINICAL DATA: Lower abdominal pain

EXAM:
CT ABDOMEN AND PELVIS WITH CONTRAST
TECHNIQUE: Multidetector CT imaging of the abdomen and pelvis was performed
using the standard protocol following bolus administration of
intravenous contrast. Oral contrast was also administered.
CONTRAST:  100mL OMNIPAQUE IOHEXOL 300 MG/ML  SOLN

[Series 2: abd/ pelvis 5.0 i30f 1 · axial · 0.79mm/px · z∈[+850,+1305]mm · 12 of 103 slices shown, 14 images]
[im 6/103  soft-tissue]
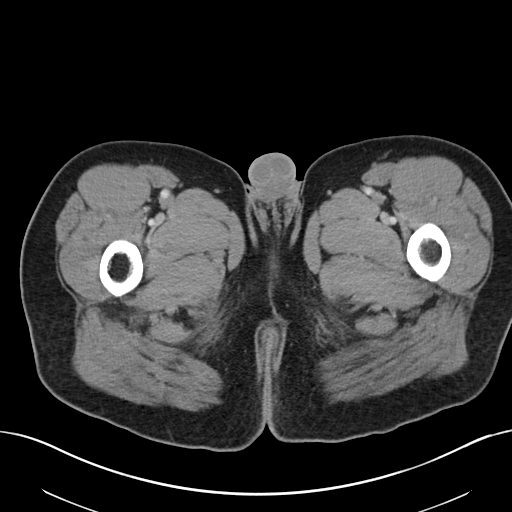
[im 6/103  bone]
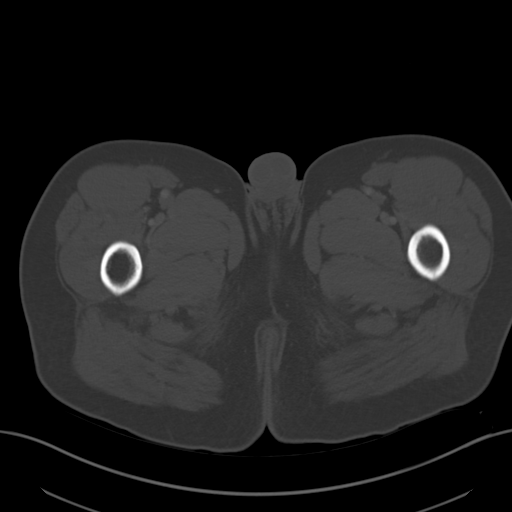
[im 18/103  soft-tissue]
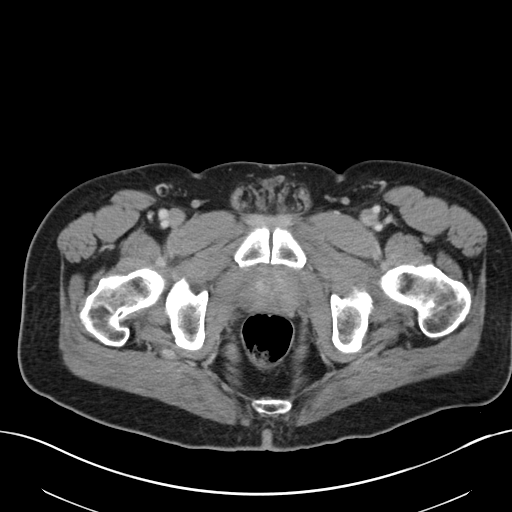
[im 23/103  soft-tissue]
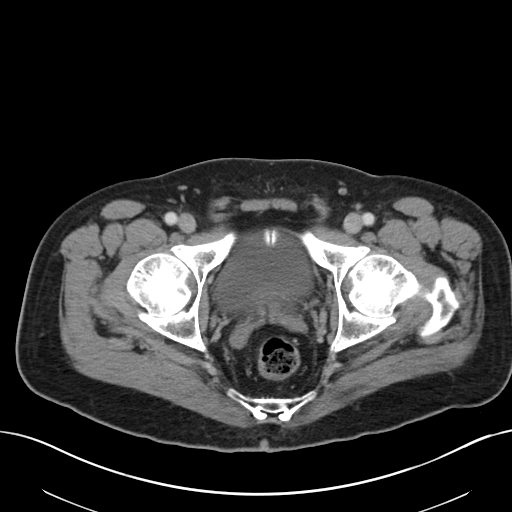
[im 29/103  soft-tissue]
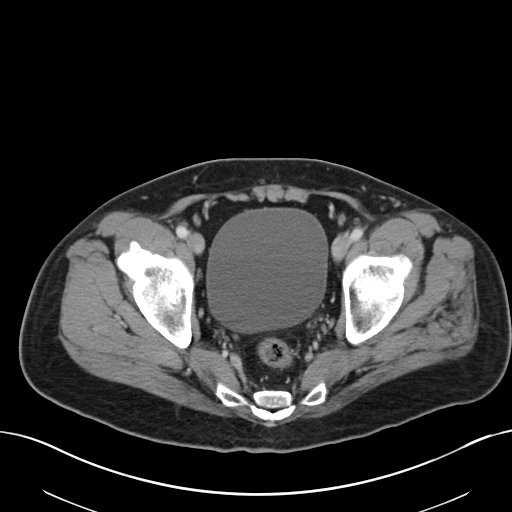
[im 40/103  soft-tissue]
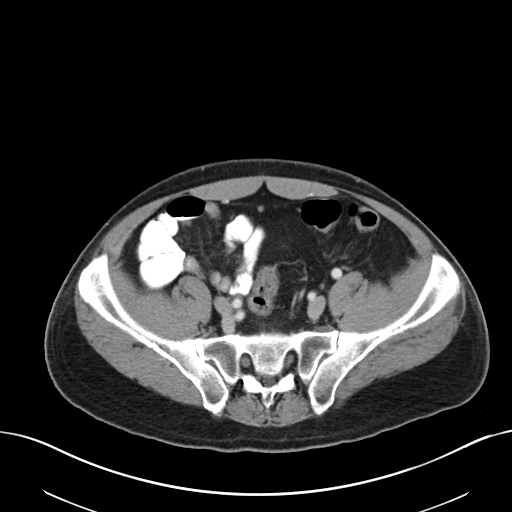
[im 46/103  soft-tissue]
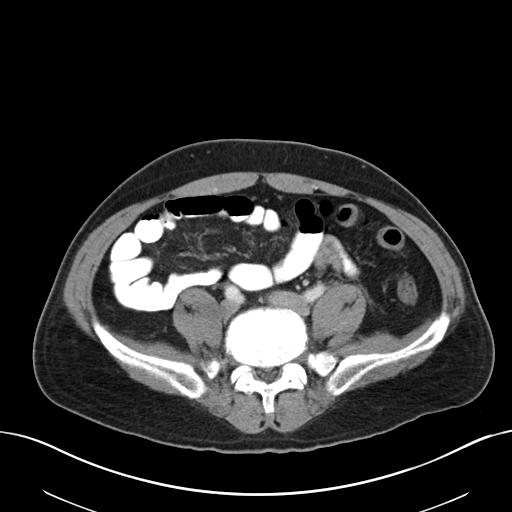
[im 57/103  soft-tissue]
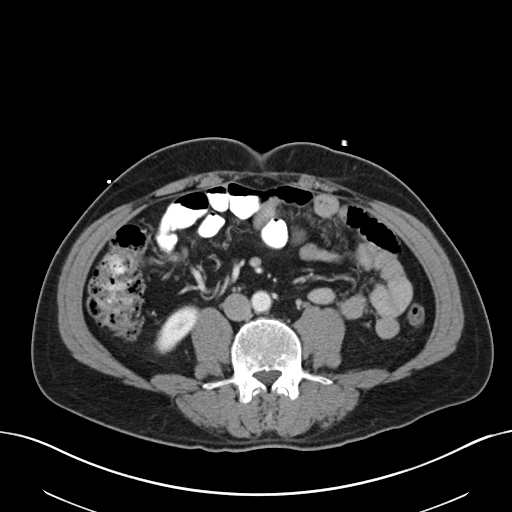
[im 63/103  soft-tissue]
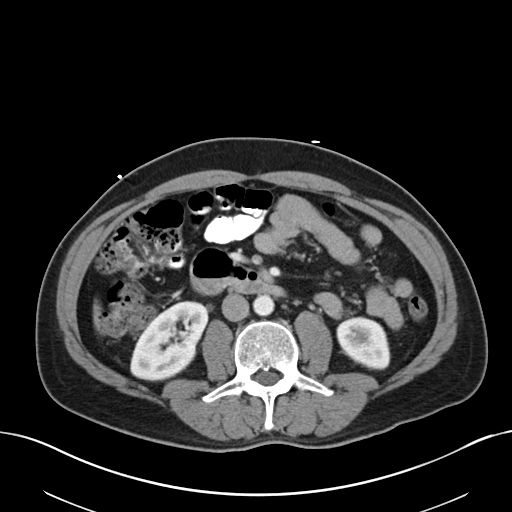
[im 74/103  soft-tissue]
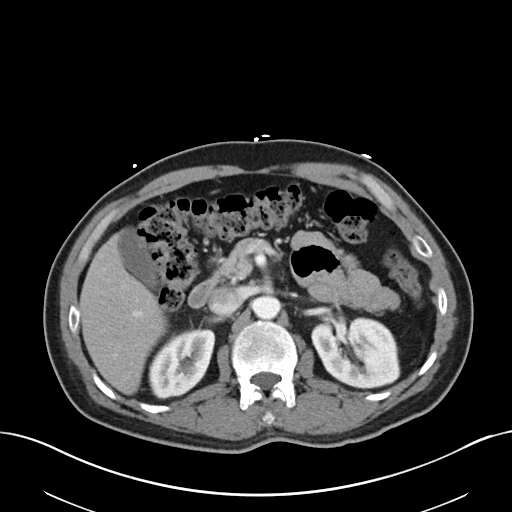
[im 74/103  bone]
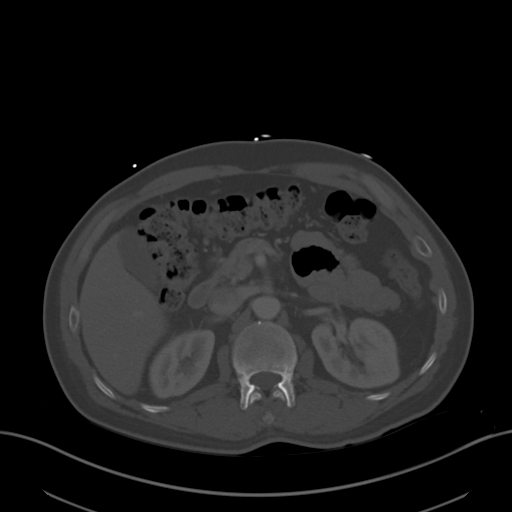
[im 80/103  soft-tissue]
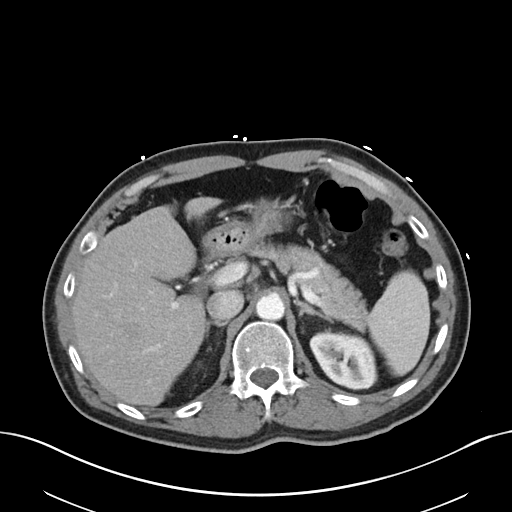
[im 86/103  soft-tissue]
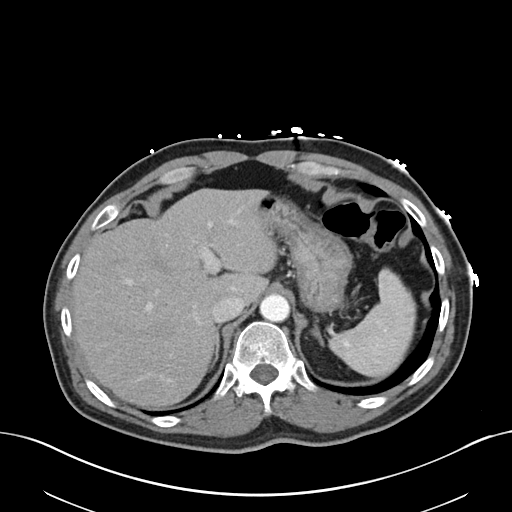
[im 97/103  soft-tissue]
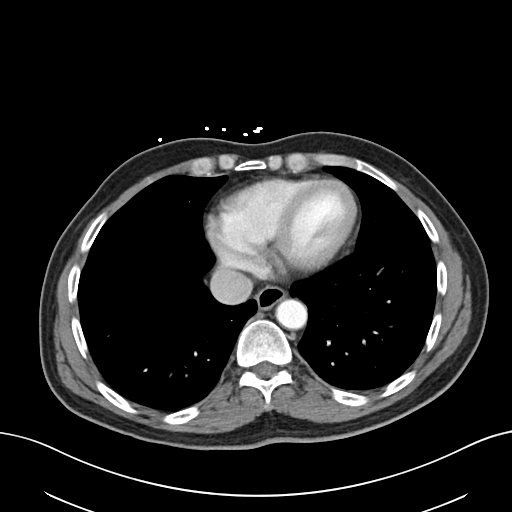

[Series 5: coronal soft tissue · coronal · 0.81mm/px · 3 of 83 slices shown]
[im 28/83  soft-tissue]
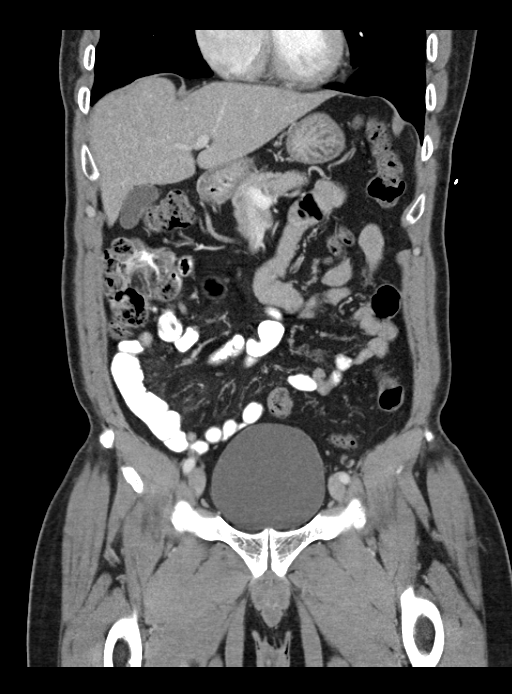
[im 37/83  soft-tissue]
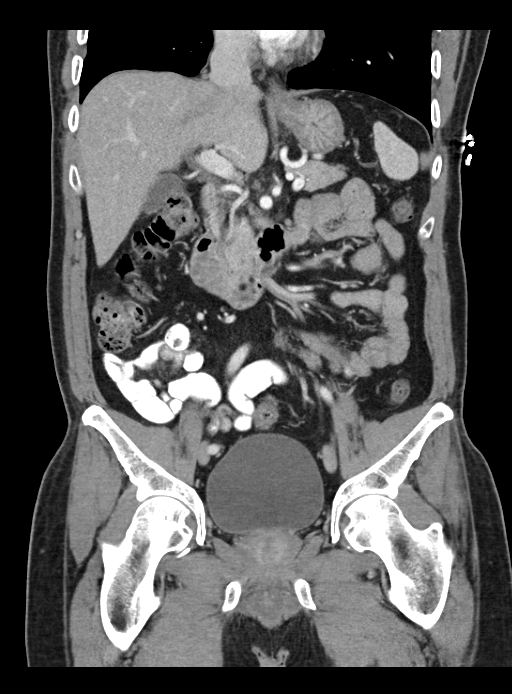
[im 46/83  soft-tissue]
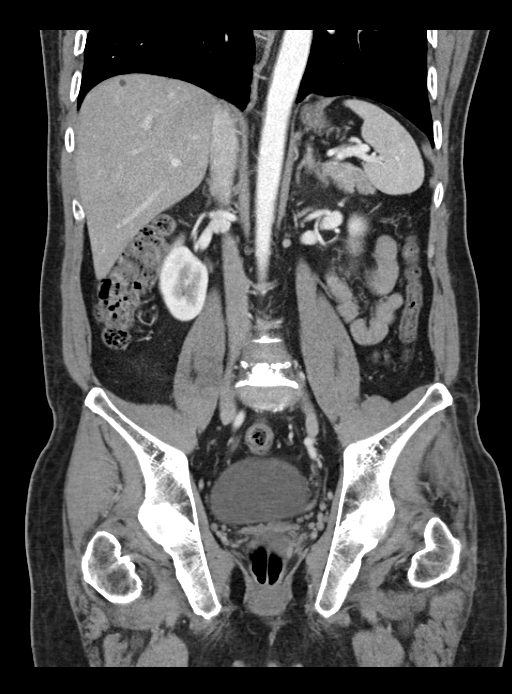

[15 of 46 positions shown; findings below may reference images not displayed]

FINDINGS: Lung bases are clear.

There is a 5 mm cyst in the dome of the liver on the right. No other
focal liver lesion identified. There is some fatty infiltration near
the fissure for the ligamentum teres. Gallbladder wall is not
thickened. There is no biliary duct dilatation.

Spleen, pancreas, and adrenals appear normal. Kidneys bilaterally
show no mass or hydronephrosis on either side. There are 2 renal
arteries on the left, both widely patent. There is a single renal
artery on the right, patent. There is no renal or ureteral calculus
on either side.

In the pelvis, the urinary bladder is midline with normal wall
thickness. There is no appreciable pelvic mass or pelvic fluid
collection. The appendix appears normal.

There is moderate stool in the colon. There is a short segment of
slight wall thickening in the mid sigmoid colon. No diverticular
changes noted in this region. There is no bowel obstruction. No free
air or portal venous air. There is no appreciable ascites,
adenopathy, or abscess in the abdomen or pelvis. There is no
abdominal aortic aneurysm. There is degenerative change in the
lumbar spine. There is moderate stenosis at L4-5, felt to be in part
congenital in part due to diffuse disc protrusion and bony
hypertrophy. Somewhat similar changes are present at L5-S1. There is
extensive disc degeneration at L4-5 and L5-S1. No blastic or lytic
bone lesions are appreciated.
IMPRESSION: Suspect a degree of focal colitis in the mid sigmoid region.
Specifically, there is a short segment of mild wall thickening in
this area. There is subtle hyperemia in this area as well. There is
no surrounding mesenteric thickening, and no diverticulitis.

No bowel obstruction.  No abscess.  Appendix appears normal.

No renal or ureteral calculus.  No hydronephrosis.

Spinal stenosis at L4-5 and to a slightly lesser degree at L5-S1,
multifactorial.

Fatty infiltration in liver.

## 2016-05-31 ENCOUNTER — Encounter: Payer: Self-pay | Admitting: Nurse Practitioner

## 2016-06-07 ENCOUNTER — Encounter: Payer: Self-pay | Admitting: Gastroenterology

## 2016-06-15 ENCOUNTER — Encounter: Payer: Self-pay | Admitting: Gastroenterology

## 2016-06-15 ENCOUNTER — Ambulatory Visit (INDEPENDENT_AMBULATORY_CARE_PROVIDER_SITE_OTHER): Payer: Self-pay | Admitting: Gastroenterology

## 2016-06-15 ENCOUNTER — Encounter (INDEPENDENT_AMBULATORY_CARE_PROVIDER_SITE_OTHER): Payer: Self-pay

## 2016-06-15 VITALS — BP 128/84 | HR 88 | Ht 72.54 in | Wt 199.0 lb

## 2016-06-15 DIAGNOSIS — R195 Other fecal abnormalities: Secondary | ICD-10-CM

## 2016-06-15 DIAGNOSIS — R1084 Generalized abdominal pain: Secondary | ICD-10-CM

## 2016-06-15 NOTE — Patient Instructions (Signed)

## 2016-06-15 NOTE — Progress Notes (Addendum)
06/15/2016 Zachary Hester [id number] 11-Jul-1960   HISTORY OF PRESENT ILLNESS:  This is a 56 year old male who is new to our practice.  He has been referred here by his PCP, Dr. Rolley Sims, for evaluation of hemoccult positive stool and abdominal pain.  The patient has never seen GI in the past and has never undergone colonoscopy.  He says that he has been having some lower abdominal pain that comes and goes for the past month or so.  Seems to occur at night but does not last long.  Cannot really describe what it feels like.  Had recent labs by PCP and office visit.  Reports that he had microscopic blood on exam, but denies ever seeing blood or having black stools.  Says that his stools are normal but has been seeing mucus with them.  Of note, he had a CT scan of the abdomen and pelvis in 01/2014 that showed the following:  IMPRESSION: Suspect a degree of focal colitis in the mid sigmoid region. Specifically, there is a short segment of mild wall thickening in this area. There is subtle hyperemia in this area as well. There is no surrounding mesenteric thickening, and no diverticulitis.  No bowel obstruction.  No abscess.  Appendix appears normal.  No renal or ureteral calculus.  No hydronephrosis.  Spinal stenosis at L4-5 and to a slightly lesser degree at L5-S1, multifactorial.  Fatty infiltration in liver.  Past Medical History:  Diagnosis Date  . Back pain   . Chronic back pain   . HTN (hypertension)   . Hypercholesteremia    Past Surgical History:  Procedure Laterality Date  . NO PAST SURGERIES      reports that he has never smoked. He has never used smokeless tobacco. He reports that he does not drink alcohol or use drugs. family history includes Diabetes in his sister; Heart disease in his sister; Hypertension in his mother. No Known Allergies    Outpatient Encounter Prescriptions as of 06/15/2016  Medication Sig  . atorvastatin (LIPITOR) 20 MG tablet  Take 20 mg by mouth daily.  . Ergocalciferol (VITAMIN D2 PO) Take 1 tablet by mouth daily.  . traMADol (ULTRAM) 50 MG tablet Take 2 tablets (100 mg total) by mouth every 8 (eight) hours as needed.  . [DISCONTINUED] ciprofloxacin (CIPRO) 500 MG tablet Take 1 tablet (500 mg total) by mouth 2 (two) times daily.  . [DISCONTINUED] HYDROcodone-acetaminophen (NORCO/VICODIN) 5-325 MG per tablet Take 1 tablet by mouth every 6 (six) hours as needed for moderate pain.  . [DISCONTINUED] hydrOXYzine (ATARAX/VISTARIL) 25 MG tablet Take 1 tablet (25 mg total) by mouth every 6 (six) hours.  . [DISCONTINUED] ibuprofen (ADVIL,MOTRIN) 400 MG tablet Take 400 mg by mouth every 6 (six) hours as needed for headache or mild pain.  . [DISCONTINUED] meloxicam (MOBIC) 7.5 MG tablet Take 1 tablet (7.5 mg total) by mouth daily. (Patient not taking: Reported on 01/24/2014)  . [DISCONTINUED] metroNIDAZOLE (FLAGYL) 500 MG tablet Take 1 tablet (500 mg total) by mouth 2 (two) times daily.  . [DISCONTINUED] predniSONE (STERAPRED UNI-PAK 21 TAB) 10 MG (21) TBPK tablet Take 1 tablet (10 mg total) by mouth daily. Take 6 tabs by mouth daily  for 2 days, then 5 tabs for 2 days, then 4 tabs for 2 days, then 3 tabs for 2 days, 2 tabs for 2 days, then 1 tab by mouth daily for 2 days  . [DISCONTINUED] triamcinolone (KENALOG) 0.025 % ointment Apply 1  application topically 2 (two) times daily.   No facility-administered encounter medications on file as of 06/15/2016.      REVIEW OF SYSTEMS  : All other systems reviewed and negative except where noted in the History of Present Illness.   PHYSICAL EXAM: BP 128/84 (BP Location: Left Arm, Patient Position: Sitting, Cuff Size: Normal)   Pulse 88   Ht 6' 0.54" (1.842 m) Comment: height measured without shoes  Wt 199 lb (90.3 kg)   BMI 26.59 kg/m  General: Well developed male in no acute distress Head: Normocephalic and atraumatic Eyes:  Sclerae anicteric, conjunctiva pink. Ears: Normal  auditory acuity Lungs: Clear throughout to auscultation Heart: Regular rate and rhythm Abdomen: Soft, non-distended.  Normal bowel sounds.  Mild diffuse TTP > in lower abdomen. Rectal:  Will be done at the time of colonoscopy. Musculoskeletal: Symmetrical with no gross deformities  Skin: No lesions on visible extremities Extremities: No edema  Neurological: Alert oriented x 4, grossly non-focal Psychological:  Alert and cooperative. Normal mood and affect  ASSESSMENT AND PLAN: -56 year old male with reported heme positive stool at PCP's office, some lower abdominal pain, mucus in stool:  Never had colonoscopy in the past.  Will schedule with Dr. Lavon PaganiniNandigam.  Will also request recent labs and office note from PCP visit.  *The risks, benefits, and alternatives to colonoscopy were discussed with the patient and he consents to proceed.    CC:  Carole CivilHussain, Sophia A, MD

## 2016-06-16 ENCOUNTER — Encounter: Payer: Self-pay | Admitting: Gastroenterology

## 2016-06-16 ENCOUNTER — Ambulatory Visit (AMBULATORY_SURGERY_CENTER): Payer: Self-pay | Admitting: Gastroenterology

## 2016-06-16 VITALS — BP 130/57 | HR 76 | Temp 98.0°F | Resp 19 | Ht 72.0 in | Wt 199.0 lb

## 2016-06-16 DIAGNOSIS — R195 Other fecal abnormalities: Secondary | ICD-10-CM

## 2016-06-16 MED ORDER — SODIUM CHLORIDE 0.9 % IV SOLN: 500.0000 mL | INTRAVENOUS | Status: DC

## 2016-06-16 NOTE — Progress Notes (Signed)
Reviewed and agree with documentation and assessment and plan. K. Veena Nandigam , MD   

## 2016-06-16 NOTE — Op Note (Signed)
[facility] Patient Name: Zachary Hester Procedure Date: 06/16/2016 2:18 PM MRN: [id number] Endoscopist: Napoleon FormKavitha V. Edwen Mclester , MD Age: 56 Referring MD:  Date of Birth: August 11, 1960 Gender: Male Account #: [id number] Procedure:                Colonoscopy Indications:              This is the patient's first colonoscopy, Heme                            positive stool Medicines:                Monitored Anesthesia Care Procedure:                Pre-Anesthesia Assessment:                           - Prior to the procedure, a History and Physical                            was performed, and patient medications and                            allergies were reviewed. The patient's tolerance of                            previous anesthesia was also reviewed. The risks                            and benefits of the procedure and the sedation                            options and risks were discussed with the patient.                            All questions were answered, and informed consent                            was obtained. Prior Anticoagulants: The patient has                            taken no previous anticoagulant or antiplatelet                            agents. ASA Grade Assessment: II - A patient with                            mild systemic disease. After reviewing the risks                            and benefits, the patient was deemed in                            satisfactory condition to undergo the procedure.  After obtaining informed consent, the colonoscope                            was passed under direct vision. Throughout the                            procedure, the patient's blood pressure, pulse, and                            oxygen saturations were monitored continuously. The                            Colonoscope was introduced through the anus and                            advanced to the the terminal ileum, with                         identification of the appendiceal orifice and IC                            valve. The colonoscopy was performed without                            difficulty. The patient tolerated the procedure                            well. The quality of the bowel preparation was                            excellent. The terminal ileum, ileocecal valve,                            appendiceal orifice, and rectum were photographed. Scope In: 2:34:32 PM Scope Out: 2:46:51 PM Scope Withdrawal Time: 0 hours 8 minutes 0 seconds  Total Procedure Duration: 0 hours 12 minutes 19 seconds  Findings:                 The perianal and digital rectal examinations were                            normal.                           The colon (entire examined portion) appeared normal. Complications:            No immediate complications. Estimated Blood Loss:     Estimated blood loss: none. Impression:               - The entire examined colon is normal.                           - No specimens collected. Recommendation:           - Patient has a contact number available for  emergencies. The signs and symptoms of potential                            delayed complications were discussed with the                            patient. Return to normal activities tomorrow.                            Written discharge instructions were provided to the                            patient.                           - Resume previous diet.                           - Continue present medications.                           - Repeat colonoscopy in 10 years for screening                            purposes.                           - Return to GI clinic PRN. Napoleon Form, MD 06/16/2016 2:53:07 PM This report has been signed electronically.

## 2016-06-16 NOTE — Progress Notes (Signed)
To recovery, report to Jones, RN, VSS 

## 2016-06-16 NOTE — Patient Instructions (Signed)
YOU HAD AN ENDOSCOPIC PROCEDURE TODAY AT THE [facility]:   Refer to the procedure report that was given to you for any specific questions about what was found during the examination.  If the procedure report does not answer your questions, please call your gastroenterologist to clarify.  If you requested that your care partner not be given the details of your procedure findings, then the procedure report has been included in a sealed envelope for you to review at your convenience later.  YOU SHOULD EXPECT: Some feelings of bloating in the abdomen. Passage of more gas than usual.  Walking can help get rid of the air that was put into your GI tract during the procedure and reduce the bloating. If you had a lower endoscopy (such as a colonoscopy or flexible sigmoidoscopy) you may notice spotting of blood in your stool or on the toilet paper. If you underwent a bowel prep for your procedure, you may not have a normal bowel movement for a few days.  Please Note:  You might notice some irritation and congestion in your nose or some drainage.  This is from the oxygen used during your procedure.  There is no need for concern and it should clear up in a day or so.  SYMPTOMS TO REPORT IMMEDIATELY:   Following lower endoscopy (colonoscopy or flexible sigmoidoscopy):  Excessive amounts of blood in the stool  Significant tenderness or worsening of abdominal pains  Swelling of the abdomen that is new, acute  Fever of 100F or higher   For urgent or emergent issues, a gastroenterologist can be reached at any hour by calling [phone].   DIET:  We do recommend a small meal at first, but then you may proceed to your regular diet.  Drink plenty of fluids but you should avoid alcoholic beverages for 24 hours.  ACTIVITY:  You should plan to take it easy for the rest of today and you should NOT DRIVE or use heavy machinery until tomorrow (because of the sedation medicines used during the test).     FOLLOW UP: Our staff will call the number listed on your records the next business day following your procedure to check on you and address any questions or concerns that you may have regarding the information given to you following your procedure. If we do not reach you, we will leave a message.  However, if you are feeling well and you are not experiencing any problems, there is no need to return our call.  We will assume that you have returned to your regular daily activities without incident.  If any biopsies were taken you will be contacted by phone or by letter within the next 1-3 weeks.  Please call us at [phone] if you have not heard about the biopsies in 3 weeks.  Repeat Colonoscopy in 10 years, return to GI clinic as needed    SIGNATURES/CONFIDENTIALITY: You and/or your care partner have signed paperwork which will be entered into your electronic medical record.  These signatures attest to the fact that that the information above on your After Visit Summary has been reviewed and is understood.  Full responsibility of the confidentiality of this discharge information lies with you and/or your care-partner.

## 2016-06-17 ENCOUNTER — Telehealth: Payer: Self-pay | Admitting: *Deleted

## 2016-06-17 NOTE — Telephone Encounter (Signed)
  Follow up Call-  Call back number 06/16/2016  Post procedure Call Back phone  # [phone]  Permission to leave phone message Yes  Some recent data might be hidden     Patient questions:  Do you have a fever, pain , or abdominal swelling? No. Pain Score  0 *  Have you tolerated food without any problems? Yes.    Have you been able to return to your normal activities? Yes.    Do you have any questions about your discharge instructions: Diet   No. Medications  No. Follow up visit  No.  Do you have questions or concerns about your Care? No.  Actions: * If pain score is 4 or above: No action needed, pain <4.

## 2016-06-21 ENCOUNTER — Encounter: Payer: Self-pay | Admitting: Family Medicine

## 2016-06-21 ENCOUNTER — Ambulatory Visit: Payer: Self-pay | Attending: Family Medicine | Admitting: Family Medicine

## 2016-06-21 VITALS — BP 132/81 | HR 79 | Temp 98.0°F | Resp 18 | Ht 73.0 in | Wt 200.6 lb

## 2016-06-21 DIAGNOSIS — E7800 Pure hypercholesterolemia, unspecified: Secondary | ICD-10-CM

## 2016-06-21 DIAGNOSIS — E559 Vitamin D deficiency, unspecified: Secondary | ICD-10-CM

## 2016-06-21 DIAGNOSIS — E785 Hyperlipidemia, unspecified: Secondary | ICD-10-CM | POA: Insufficient documentation

## 2016-06-21 DIAGNOSIS — M5442 Lumbago with sciatica, left side: Secondary | ICD-10-CM

## 2016-06-21 DIAGNOSIS — R12 Heartburn: Secondary | ICD-10-CM

## 2016-06-21 DIAGNOSIS — G8929 Other chronic pain: Secondary | ICD-10-CM

## 2016-06-21 DIAGNOSIS — Z0000 Encounter for general adult medical examination without abnormal findings: Secondary | ICD-10-CM

## 2016-06-21 DIAGNOSIS — I10 Essential (primary) hypertension: Secondary | ICD-10-CM

## 2016-06-21 DIAGNOSIS — M545 Low back pain: Secondary | ICD-10-CM | POA: Insufficient documentation

## 2016-06-21 DIAGNOSIS — Z23 Encounter for immunization: Secondary | ICD-10-CM

## 2016-06-21 LAB — POCT UA - MICROALBUMIN
Creatinine, POC: 300 mg/dL
Microalbumin Ur, POC: 30 mg/L

## 2016-06-21 MED ORDER — ATORVASTATIN CALCIUM 20 MG PO TABS: 20.0000 mg | ORAL_TABLET | Freq: Every day | ORAL | 2 refills | Status: DC

## 2016-06-21 MED ORDER — IBUPROFEN 600 MG PO TABS: 600.0000 mg | ORAL_TABLET | Freq: Three times a day (TID) | ORAL | 1 refills | Status: DC | PRN

## 2016-06-21 MED ORDER — RANITIDINE HCL 150 MG PO TABS
150.0000 mg | ORAL_TABLET | Freq: Two times a day (BID) | ORAL | 0 refills | Status: DC | PRN
Start: 2016-06-21 — End: 2018-06-21

## 2016-06-21 NOTE — Progress Notes (Signed)
Subjective:  Patient ID: Zachary Hester, male    DOB: 02/21/1960  Age: 56 y.o. MRN: [id number]  CC: Establish Care   HPI Zachary Hester presents for  Recent physical performed at an outpatient clinic. He reports being diagnosed with HTN, hyperlipidemia, and vitamin d deficiency. History of chronic lower back pain.He brings his current medications with him. Requests medication refills.  Hypertension: Patient here for follow-up of elevated blood pressure. He is exercising and is not adherent to low salt diet.  Blood pressure He does not check blood sugras at home.  well controlled at home. Cardiac symptoms none. Patient denies chest pain, claudication, dyspnea, fatigue, lower extremity edema, palpitations and syncope.  Cardiovascular risk factors: advanced age (older than 93 for men, 70 for women), dyslipidemia, hypertension, male gender and sedentary lifestyle. Use of agents associated with hypertension: NSAIDS. History of target organ damage: none.  Hyperlipidemia: Patient reports history of  hyperlipidemia.  He is currently taking atorvastatin 20 mg QD. Cardiac symptoms:  negative.  Family history of hyperlipidemia is unknown. There is not a family history of early ischemia heart disease.  Low Back Pain: Patient complains of chronic low back pain. Denies any significant history of injury. The patient first noted symptoms 10years ago. 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 , and is located at the bilateral lower back. The pain is described as aching and occurs all day. The symptoms denies been progressive. Symptoms are exacerbated by extension and flexion. Factors which relieve the pain include muscle relaxants, narcotic pain medications and rest. Other associated symptoms include tingling in the left leg. Previous history of symptoms: the problem is long-standing.  Treatment efforts have  included rest, OTC NSAIDS, prescription NSAIDS, muscle relaxers and narcotic pain medication, with and without relief.     Outpatient Medications Prior to Visit  Medication Sig Dispense Refill  . Ergocalciferol (VITAMIN D2 PO) Take 1 tablet by mouth daily.    Marland Kitchen atorvastatin (LIPITOR) 20 MG tablet Take 20 mg by mouth daily.    . traMADol (ULTRAM) 50 MG tablet Take 2 tablets (100 mg total) by mouth every 8 (eight) hours as needed. 30 tablet 0   Facility-Administered Medications Prior to Visit  Medication Dose Route Frequency Provider Last Rate Last Dose  . 0.9 %  sodium chloride infusion  500 mL Intravenous Continuous Nandigam, Kavitha V, MD      . 0.9 %  sodium chloride infusion  500 mL Intravenous Continuous Nandigam, Eleonore Chiquito, MD        ROS Review of Systems  Constitutional: Negative.   Eyes: Negative.   Respiratory: Negative.   Cardiovascular: Negative.   Gastrointestinal: Negative.   Musculoskeletal: Positive for back pain.  Skin: Negative.     Objective:  BP 132/81 (BP Location: Left Arm, Patient Position: Sitting, Cuff Size: Normal)   Pulse 79   Temp 98 F (36.7 C) (Oral)   Resp 18   Ht 6\' 1"  (1.854 m)   Wt 200 lb 9.6 oz (91 kg)   SpO2 98%   BMI 26.47 kg/m   BP/Weight 06/21/2016 06/16/2016 06/15/2016  Systolic BP 132 130 128  Diastolic BP 81 57 84  Wt. (Lbs) 200.6 199 199  BMI 26.47 26.99 26.59     Physical Exam  Constitutional: He is oriented to person, place, and time. He appears well-developed and well-nourished.  HENT:  Head: Normocephalic and atraumatic.  Right Ear: External ear normal.  Left Ear: External ear normal.  Nose: Nose normal.  Mouth/Throat: Oropharynx is clear and moist.  Eyes: Conjunctivae are normal. Pupils are equal, round, and reactive to light.  Neck: Normal range of motion. No JVD present.  Cardiovascular: Normal rate, regular rhythm, normal heart sounds and intact distal pulses.   Pulmonary/Chest: Effort normal and breath sounds  normal.  Abdominal: Soft. Bowel sounds are normal.  Musculoskeletal:       Lumbar back: He exhibits pain (flexion/extension). He exhibits no spasm.  Neurological: He is alert and oriented to person, place, and time. He has normal reflexes.  Skin: Skin is warm and dry.  Psychiatric: He has a normal mood and affect.  Nursing note and vitals reviewed.   Assessment & Plan:   Problem List Items Addressed This Visit      Other   Chronic back pain - Primary   Currently has supply of tramadol available. No additional refills until needed.    Relevant Medications   traMADol (ULTRAM) 50 MG tablet   ibuprofen (ADVIL,MOTRIN) 600 MG tablet   Other Relevant Orders   Ambulatory referral to Orthopedics   DG Lumbar Spine Complete   Hypercholesteremia   Relevant Medications   atorvastatin (LIPITOR) 20 MG tablet   Vitamin D deficiency   Continue to take vitamin d supplement medication that was prescribed   Recheck in a few months.   Heartburn   Relevant Medications   ranitidine (ZANTAC) 150 MG tablet    Other Visit Diagnoses    Hypertension, unspecified type       Schedule BP recheck in 2 weeks with clinic RN   If BP is greater than 90/60 (MAP 65 or greater) but not less than 130/80 may    add dose of  HCTZ 12.5 mg QD and recheck in another 2 weeks with clinic RN.   Follow up with PCP in 3 months.   Other Relevant Orders   Basic metabolic panel (Completed)   POCT UA - Microalbumin (Completed)   Healthcare maintenance       Relevant Orders   Tdap vaccine greater than or equal to 7yo IM (Completed)   Hepatitis C Antibody (Completed)   HIV antibody (with reflex) (Completed)      Meds ordered this encounter  Medications  . atorvastatin (LIPITOR) 20 MG tablet    Sig: Take 1 tablet (20 mg total) by mouth daily.    Dispense:  30 tablet    Refill:  2    Order Specific Question:   Supervising Provider    Answer:   Quentin AngstJEGEDE, OLUGBEMIGA E [id number]]  . ranitidine (ZANTAC) 150 MG tablet     Sig: Take 1 tablet (150 mg total) by mouth 2 (two) times daily as needed for heartburn.    Dispense:  30 tablet    Refill:  0    Order Specific Question:   Supervising Provider    Answer:   Quentin AngstJEGEDE, OLUGBEMIGA E [id number]  . ibuprofen (ADVIL,MOTRIN) 600 MG tablet    Sig: Take 1 tablet (600 mg total) by mouth every 8 (eight) hours as needed for moderate pain or cramping (Take with food.).    Dispense:  30 tablet    Refill:  1    Order Specific Question:   Supervising Provider    Answer:   Quentin AngstJEGEDE, OLUGBEMIGA E [id number]    Follow-up: Return in about 2 weeks (around 07/05/2016) for BP check with clinic RN.  Zachary BarkMandesia R Jalik Gellatly FNP

## 2016-06-21 NOTE — Progress Notes (Signed)
Patient is for lower back pain  Patient has taking his current medication for today   Patient has eaten for today

## 2016-06-21 NOTE — Patient Instructions (Signed)
Hypertension °Hypertension is another name for high blood pressure. High blood pressure forces your heart to work harder to pump blood. This can cause problems over time. °There are two numbers in a blood pressure reading. There is a top number (systolic) over a bottom number (diastolic). It is best to have a blood pressure below 120/80. Healthy choices can help lower your blood pressure. You may need medicine to help lower your blood pressure if: °· Your blood pressure cannot be lowered with healthy choices. °· Your blood pressure is higher than 130/80. °Follow these instructions at home: °Eating and drinking  °· If directed, follow the DASH eating plan. This diet includes: °¨ Filling half of your plate at each meal with fruits and vegetables. °¨ Filling one quarter of your plate at each meal with whole grains. Whole grains include whole wheat pasta, brown rice, and whole grain bread. °¨ Eating or drinking low-fat dairy products, such as skim milk or low-fat yogurt. °¨ Filling one quarter of your plate at each meal with low-fat (lean) proteins. Low-fat proteins include fish, skinless chicken, eggs, beans, and tofu. °¨ Avoiding fatty meat, cured and processed meat, or chicken with skin. °¨ Avoiding premade or processed food. °· Eat less than 1,500 mg of salt (sodium) a day. °· Limit alcohol use to no more than 1 drink a day for nonpregnant women and 2 drinks a day for men. One drink equals 12 oz of beer, 5 oz of wine, or 1½ oz of hard liquor. °Lifestyle  °· Work with your doctor to stay at a healthy weight or to lose weight. Ask your doctor what the best weight is for you. °· Get at least 30 minutes of exercise that causes your heart to beat faster (aerobic exercise) most days of the week. This may include walking, swimming, or biking. °· Get at least 30 minutes of exercise that strengthens your muscles (resistance exercise) at least 3 days a week. This may include lifting weights or pilates. °· Do not use any  products that contain nicotine or tobacco. This includes cigarettes and e-cigarettes. If you need help quitting, ask your doctor. °· Check your blood pressure at home as told by your doctor. °· Keep all follow-up visits as told by your doctor. This is important. °Medicines  °· Take over-the-counter and prescription medicines only as told by your doctor. Follow directions carefully. °· Do not skip doses of blood pressure medicine. The medicine does not work as well if you skip doses. Skipping doses also puts you at risk for problems. °· Ask your doctor about side effects or reactions to medicines that you should watch for. °Contact a doctor if: °· You think you are having a reaction to the medicine you are taking. °· You have headaches that keep coming back (recurring). °· You feel dizzy. °· You have swelling in your ankles. °· You have trouble with your vision. °Get help right away if: °· You get a very bad headache. °· You start to feel confused. °· You feel weak or numb. °· You feel faint. °· You get very bad pain in your: °¨ Chest. °¨ Belly (abdomen). °· You throw up (vomit) more than once. °· You have trouble breathing. °Summary °· Hypertension is another name for high blood pressure. °· Making healthy choices can help lower blood pressure. If your blood pressure cannot be controlled with healthy choices, you may need to take medicine. °This information is not intended to replace advice given to you by your   health care provider. Make sure you discuss any questions you have with your health care provider. Document Released: 07/14/2007 Document Revised: 12/24/2015 Document Reviewed: 12/24/2015 Elsevier Interactive Patient Education  2017 [employer] Inc.   Back Pain, Adult Back pain is very common. The pain often gets better over time. The cause of back pain is usually not dangerous. Most people can learn to manage their back pain on their own. Follow these instructions at home: Watch your back pain for any  changes. The following actions may help to lessen any pain you are feeling:  Stay active. Start with short walks on flat ground if you can. Try to walk farther each day.  Exercise regularly as told by your doctor. Exercise helps your back heal faster. It also helps avoid future injury by keeping your muscles strong and flexible.  Do not sit, drive, or stand in one place for more than 30 minutes.  Do not stay in bed. Resting more than 1-2 days can slow down your recovery.  Be careful when you bend or lift an object. Use good form when lifting:  Bend at your knees.  Keep the object close to your body.  Do not twist.  Sleep on a firm mattress. Lie on your side, and bend your knees. If you lie on your back, put a pillow under your knees.  Take medicines only as told by your doctor.  Put ice on the injured area.  Put ice in a plastic bag.  Place a towel between your skin and the bag.  Leave the ice on for 20 minutes, 2-3 times a day for the first 2-3 days. After that, you can switch between ice and heat packs.  Avoid feeling anxious or stressed. Find good ways to deal with stress, such as exercise.  Maintain a healthy weight. Extra weight puts stress on your back. Contact a doctor if:  You have pain that does not go away with rest or medicine.  You have worsening pain that goes down into your legs or buttocks.  You have pain that does not get better in one week.  You have pain at night.  You lose weight.  You have a fever or chills. Get help right away if:  You cannot control when you poop (bowel movement) or pee (urinate).  Your arms or legs feel weak.  Your arms or legs lose feeling (numbness).  You feel sick to your stomach (nauseous) or throw up (vomit).  You have belly (abdominal) pain.  You feel like you may pass out (faint). This information is not intended to replace advice given to you by your health care provider. Make sure you discuss any questions you  have with your health care provider. Document Released: 07/14/2007 Document Revised: 07/03/2015 Document Reviewed: 05/29/2013 Elsevier Interactive Patient Education  2017 [employer]

## 2016-06-22 LAB — HEPATITIS C ANTIBODY

## 2016-06-22 LAB — BASIC METABOLIC PANEL
BUN / CREAT RATIO: 15 (ref 9–20)
BUN: 12 mg/dL (ref 6–24)
CHLORIDE: 103 mmol/L (ref 96–106)
CO2: 22 mmol/L (ref 18–29)
Calcium: 9.5 mg/dL (ref 8.7–10.2)
Creatinine, Ser: 0.81 mg/dL (ref 0.76–1.27)
GFR calc Af Amer: 115 mL/min/{1.73_m2} (ref >59)
GFR calc non Af Amer: 99 mL/min/{1.73_m2} (ref >59)
GLUCOSE: 104 mg/dL — AB (ref 65–99)
POTASSIUM: 4.3 mmol/L (ref 3.5–5.2)
SODIUM: 139 mmol/L (ref 134–144)

## 2016-06-22 LAB — HIV ANTIBODY (ROUTINE TESTING W REFLEX): HIV SCREEN 4TH GENERATION: NONREACTIVE

## 2016-06-28 ENCOUNTER — Ambulatory Visit: Payer: Self-pay | Attending: Family Medicine

## 2016-06-28 ENCOUNTER — Telehealth: Payer: Self-pay | Admitting: Family Medicine

## 2016-06-28 ENCOUNTER — Other Ambulatory Visit: Payer: Self-pay | Admitting: Family Medicine

## 2016-06-28 DIAGNOSIS — M5442 Lumbago with sciatica, left side: Principal | ICD-10-CM

## 2016-06-28 DIAGNOSIS — G8929 Other chronic pain: Secondary | ICD-10-CM

## 2016-06-28 MED ORDER — TRAMADOL HCL 50 MG PO TABS: 50.0000 mg | ORAL_TABLET | Freq: Two times a day (BID) | ORAL | 0 refills | Status: DC | PRN

## 2016-06-28 NOTE — Telephone Encounter (Signed)
PT came to the office requesting a refill for traMADol (ULTRAM) 50 MG tablet   he was told to come to the office when the finish with the prescription and he will get a refill, according to his PCP, please follow up with PT

## 2016-06-28 NOTE — Telephone Encounter (Signed)
Patient was instructed to contact office concerning tramadol refill. Prescription refill for Tramadol has been printed. No further refill will be given without scheduled office visit.

## 2016-06-28 NOTE — Telephone Encounter (Signed)
CMA call patient to let him know that his prescription is ready to pick up at front desk & to discuss his lab results  Patient did not answer but left a VM detailed that his RX is ready to pick up at front desk

## 2016-07-07 ENCOUNTER — Ambulatory Visit: Payer: Self-pay | Attending: Family Medicine | Admitting: *Deleted

## 2016-07-07 VITALS — BP 118/70

## 2016-07-07 DIAGNOSIS — Z7689 Persons encountering health services in other specified circumstances: Secondary | ICD-10-CM | POA: Insufficient documentation

## 2016-07-07 DIAGNOSIS — I10 Essential (primary) hypertension: Secondary | ICD-10-CM | POA: Insufficient documentation

## 2016-07-07 DIAGNOSIS — Z0130 Encounter for examination of blood pressure without abnormal findings: Secondary | ICD-10-CM

## 2016-07-07 NOTE — Progress Notes (Signed)
[facility] and oriented and arrives in good spirits. Last OV  06/21/16 with .  Pt denies chest pain, SOB, HA, dizziness, or blurred vision.  Verified medication, confirms he is taking medication as prescribed.  Manual blood pressure reading:  112/70 and 118/70  Pt request lab results while at nurse visit.Went over lab work while patient was in the office. Pt verbalized understanding.

## 2016-07-08 ENCOUNTER — Emergency Department (HOSPITAL_COMMUNITY)
Admission: EM | Admit: 2016-07-08 | Discharge: 2016-07-08 | Disposition: A | Discharge status: Other Acute Inpatient Hospital | Payer: Self-pay

## 2016-07-08 ENCOUNTER — Ambulatory Visit (HOSPITAL_COMMUNITY)
Admission: RE | Admit: 2016-07-08 | Discharge: 2016-07-08 | Disposition: A | Discharge status: Home / Self Care | Payer: Self-pay | Source: Ambulatory Visit | Attending: Family Medicine | Admitting: Family Medicine

## 2016-07-08 DIAGNOSIS — M5442 Lumbago with sciatica, left side: Secondary | ICD-10-CM | POA: Insufficient documentation

## 2016-07-08 DIAGNOSIS — G8929 Other chronic pain: Secondary | ICD-10-CM | POA: Insufficient documentation

## 2016-07-08 DIAGNOSIS — M5137 Other intervertebral disc degeneration, lumbosacral region: Secondary | ICD-10-CM | POA: Insufficient documentation

## 2016-07-16 ENCOUNTER — Telehealth: Payer: Self-pay

## 2016-07-16 NOTE — Telephone Encounter (Signed)
-----   Message from Lizbeth BarkMandesia R Hairston, FNP sent at 07/14/2016  1:31 PM EDT ----- Herby AbrahamXray showed osteoarthritis of the spine with loss of spinal disc height. This can cause pain and inflammation.  Follow up with orthopedic referral.

## 2016-07-16 NOTE — Telephone Encounter (Signed)
CMA call regarding results CMA Verify DOB    Patient was aware and understood

## 2016-07-21 ENCOUNTER — Ambulatory Visit: Payer: Medicaid Other | Attending: Family Medicine

## 2016-07-21 ENCOUNTER — Other Ambulatory Visit: Payer: Self-pay | Admitting: Family Medicine

## 2016-07-21 DIAGNOSIS — M5442 Lumbago with sciatica, left side: Principal | ICD-10-CM

## 2016-07-21 DIAGNOSIS — G8929 Other chronic pain: Secondary | ICD-10-CM

## 2016-07-21 NOTE — Telephone Encounter (Signed)
CMA call regarding prescription ready to pick up   Patient Verify DOB  Patient was aware and understood

## 2016-08-09 ENCOUNTER — Telehealth: Payer: Self-pay | Admitting: Family Medicine

## 2016-08-09 NOTE — Telephone Encounter (Signed)
Patient is requesting refill for tramadol. Please advised

## 2016-08-10 NOTE — Telephone Encounter (Signed)
Patient was informed previously that no further refills of Tramadol will be given without office visit. If he is experiencing pain he can be prescribed naprosyn or ibuprofen. Recommend he schedule a follow up appointment.

## 2016-08-10 NOTE — Telephone Encounter (Signed)
CMA call regarding medication refill that his PCP can not give him no more refill for tramadol until he has an appt   Patient was aware and understood

## 2016-08-13 ENCOUNTER — Ambulatory Visit: Payer: Self-pay | Attending: Family Medicine | Admitting: Family Medicine

## 2016-08-13 ENCOUNTER — Encounter: Payer: Self-pay | Admitting: Family Medicine

## 2016-08-13 VITALS — BP 125/77 | HR 83 | Temp 98.0°F | Resp 18 | Ht 73.0 in | Wt 196.6 lb

## 2016-08-13 DIAGNOSIS — G8929 Other chronic pain: Secondary | ICD-10-CM

## 2016-08-13 DIAGNOSIS — Z79899 Other long term (current) drug therapy: Secondary | ICD-10-CM | POA: Insufficient documentation

## 2016-08-13 DIAGNOSIS — Z760 Encounter for issue of repeat prescription: Secondary | ICD-10-CM

## 2016-08-13 DIAGNOSIS — M5442 Lumbago with sciatica, left side: Secondary | ICD-10-CM | POA: Insufficient documentation

## 2016-08-13 MED ORDER — TRAMADOL HCL 50 MG PO TABS: ORAL_TABLET | ORAL | 0 refills | Status: DC

## 2016-08-13 MED ORDER — IBUPROFEN 600 MG PO TABS: 600.0000 mg | ORAL_TABLET | Freq: Three times a day (TID) | ORAL | 1 refills | Status: DC | PRN

## 2016-08-13 NOTE — Patient Instructions (Addendum)
Follow up with orthopedic appointment.  Sciatica Sciatica is pain, numbness, weakness, or tingling along the path of the sciatic nerve. The sciatic nerve starts in the lower back and runs down the back of each leg. The nerve controls the muscles in the lower leg and in the back of the knee. It also provides feeling (sensation) to the back of the thigh, the lower leg, and the sole of the foot. Sciatica is a symptom of another medical condition that pinches or puts pressure on the sciatic nerve. Generally, sciatica only affects one side of the body. Sciatica usually goes away on its own or with treatment. In some cases, sciatica may keep coming back (recur). What are the causes? This condition is caused by pressure on the sciatic nerve, or pinching of the sciatic nerve. This may be the result of:  A disk in between the bones of the spine (vertebrae) bulging out too far (herniated disk).  Age-related changes in the spinal disks (degenerative disk disease).  A pain disorder that affects a muscle in the buttock (piriformis syndrome).  Extra bone growth (bone spur) near the sciatic nerve.  An injury or break (fracture) of the pelvis.  Pregnancy.  Tumor (rare).  What increases the risk? The following factors may make you more likely to develop this condition:  Playing sports that place pressure or stress on the spine, such as football or weight lifting.  Having poor strength and flexibility.  A history of back injury.  A history of back surgery.  Sitting for long periods of time.  Doing activities that involve repetitive bending or lifting.  Obesity.  What are the signs or symptoms? Symptoms can vary from mild to very severe, and they may include:  Any of these problems in the lower back, leg, hip, or buttock: ? Mild tingling or dull aches. ? Burning sensations. ? Sharp pains.  Numbness in the back of the calf or the sole of the foot.  Leg weakness.  Severe back pain that  makes movement difficult.  These symptoms may get worse when you cough, sneeze, or laugh, or when you sit or stand for long periods of time. Being overweight may also make symptoms worse. In some cases, symptoms may recur over time. How is this diagnosed? This condition may be diagnosed based on:  Your symptoms.  A physical exam. Your health care provider may ask you to do certain movements to check whether those movements trigger your symptoms.  You may have tests, including: ? Blood tests. ? X-rays. ? MRI. ? CT scan.  How is this treated? In many cases, this condition improves on its own, without any treatment. However, treatment may include:  Reducing or modifying physical activity during periods of pain.  Exercising and stretching to strengthen your abdomen and improve the flexibility of your spine.  Icing and applying heat to the affected area.  Medicines that help: ? To relieve pain and swelling. ? To relax your muscles.  Injections of medicines that help to relieve pain, irritation, and inflammation around the sciatic nerve (steroids).  Surgery.  Follow these instructions at home: Medicines  Take over-the-counter and prescription medicines only as told by your health care provider.  Do not drive or operate heavy machinery while taking prescription pain medicine. Managing pain  If directed, apply ice to the affected area. ? Put ice in a plastic bag. ? Place a towel between your skin and the bag. ? Leave the ice on for 20 minutes, 2-3 times  a day.  After icing, apply heat to the affected area before you exercise or as often as told by your health care provider. Use the heat source that your health care provider recommends, such as a moist heat pack or a heating pad. ? Place a towel between your skin and the heat source. ? Leave the heat on for 20-30 minutes. ? Remove the heat if your skin turns bright red. This is especially important if you are unable to feel  pain, heat, or cold. You may have a greater risk of getting burned. Activity  Return to your normal activities as told by your health care provider. Ask your health care provider what activities are safe for you. ? Avoid activities that make your symptoms worse.  Take brief periods of rest throughout the day. Resting in a lying or standing position is usually better than sitting to rest. ? When you rest for longer periods, mix in some mild activity or stretching between periods of rest. This will help to prevent stiffness and pain. ? Avoid sitting for long periods of time without moving. Get up and move around at least one time each hour.  Exercise and stretch regularly, as told by your health care provider.  Do not lift anything that is heavier than 10 lb (4.5 kg) while you have symptoms of sciatica. When you do not have symptoms, you should still avoid heavy lifting, especially repetitive heavy lifting.  When you lift objects, always use proper lifting technique, which includes: ? Bending your knees. ? Keeping the load close to your body. ? Avoiding twisting. General instructions  Use good posture. ? Avoid leaning forward while sitting. ? Avoid hunching over while standing.  Maintain a healthy weight. Excess weight puts extra stress on your back and makes it difficult to maintain good posture.  Wear supportive, comfortable shoes. Avoid wearing high heels.  Avoid sleeping on a mattress that is too soft or too hard. A mattress that is firm enough to support your back when you sleep may help to reduce your pain.  Keep all follow-up visits as told by your health care provider. This is important. Contact a health care provider if:  You have pain that wakes you up when you are sleeping.  You have pain that gets worse when you lie down.  Your pain is worse than you have experienced in the past.  Your pain lasts longer than 4 weeks.  You experience unexplained weight loss. Get  help right away if:  You lose control of your bowel or bladder (incontinence).  You have: ? Weakness in your lower back, pelvis, buttocks, or legs that gets worse. ? Redness or swelling of your back. ? A burning sensation when you urinate. This information is not intended to replace advice given to you by your health care provider. Make sure you discuss any questions you have with your health care provider. Document Released: 01/19/2001 Document Revised: 07/01/2015 Document Reviewed: 10/04/2014 Elsevier Interactive Patient Education  2017 [employer].

## 2016-08-13 NOTE — Progress Notes (Signed)
Patient is here for medication refill  

## 2016-08-13 NOTE — Progress Notes (Signed)
Subjective:  Patient ID: Zachary Hester, male    DOB: 10-22-1960  Age: 56 y.o. MRN: [id number]  CC: Medication Refill   HPI Zachary Hester presents for medication refill. History of  chronic low back pain. Denies any significant history of injury. The patient first noted symptoms 10 years ago. 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 , and is located at the bilateral lower back. The pain is described as aching and occurs all day. The symptoms denies been progressive. Symptoms are exacerbated by extension and flexion. Factors which relieve the pain include muscle relaxants, narcotic pain medications and rest. Other associated symptoms include tingling in the left leg. Previous history of symptoms: the problem is long-standing. Treatment efforts have included rest, OTC NSAIDS, prescription NSAIDS, muscle relaxers and narcotic pain medication, with and without relief. Recent lumbar x-ray showed DDD with disc narrowing of the L5-S1 vertebrae.  He is requesting refill of Tramadol medication. Reports upcoming appointment scheduled with orthopedic next week.   Outpatient Medications Prior to Visit  Medication Sig Dispense Refill  . atorvastatin (LIPITOR) 20 MG tablet Take 1 tablet (20 mg total) by mouth daily. 30 tablet 2  . Ergocalciferol (VITAMIN D2 PO) Take 1 tablet by mouth daily.    . ranitidine (ZANTAC) 150 MG tablet Take 1 tablet (150 mg total) by mouth 2 (two) times daily as needed for heartburn. (Patient not taking: Reported on 08/13/2016) 30 tablet 0  . ibuprofen (ADVIL,MOTRIN) 600 MG tablet Take 1 tablet (600 mg total) by mouth every 8 (eight) hours as needed for moderate pain or cramping (Take with food.). (Patient not taking: Reported on 08/13/2016) 30 tablet 1  . traMADol (ULTRAM) 50 MG tablet TAKE 1 TABLET BY MOUTH EVERY 12 HOURS AS NEEDED FOR SEVERE PAIN. (Patient not taking: Reported on  08/13/2016) 30 tablet 0   Facility-Administered Medications Prior to Visit  Medication Dose Route Frequency Provider Last Rate Last Dose  . 0.9 %  sodium chloride infusion  500 mL Intravenous Continuous Nandigam, Kavitha V, MD      . 0.9 %  sodium chloride infusion  500 mL Intravenous Continuous Nandigam, Eleonore Chiquito, MD        ROS Review of Systems  Constitutional: Negative.   Respiratory: Negative.   Cardiovascular: Negative.   Musculoskeletal: Positive for back pain.  Neurological:       Paresthesias left leg    Objective:  BP 125/77 (BP Location: Left Arm, Patient Position: Sitting, Cuff Size: Normal)   Pulse 83   Temp 98 F (36.7 C) (Oral)   Resp 18   Ht 6\' 1"  (1.854 m)   Wt 196 lb 9.6 oz (89.2 kg)   SpO2 96%   BMI 25.94 kg/m   BP/Weight 08/13/2016 07/08/2016 07/07/2016  Systolic BP 125 112 118  Diastolic BP 77 72 70  Wt. (Lbs) 196.6 - -  BMI 25.94 - -     Physical Exam  Constitutional: He appears well-developed and well-nourished.  Cardiovascular: Normal rate, regular rhythm, normal heart sounds and intact distal pulses.   Pulmonary/Chest: Effort normal and breath sounds normal.  Musculoskeletal:       Lumbar back: He exhibits pain (positive straight leg raise test.; parathesias to left leg).  Skin: Skin is warm and dry.  Nursing note and vitals reviewed.  Assessment & Plan:   Problem List Items Addressed This Visit  Nervous and Auditory   Chronic bilateral low back pain with left-sided sciatica - Primary   Encouraged to follow up with ortho appt.   Relevant Medications   traMADol (ULTRAM) 50 MG tablet   ibuprofen (ADVIL,MOTRIN) 600 MG tablet    Other Visit Diagnoses    Medication refill       Patient signed CSA form.    Relevant Orders   Drug Screen, Urine      Meds ordered this encounter  Medications  . traMADol (ULTRAM) 50 MG tablet    Sig: TAKE 1 TABLET BY MOUTH EVERY 12 HOURS AS NEEDED FOR SEVERE PAIN.    Dispense:  60 tablet    Refill:  0     Must have office visit for refill.    Order Specific Question:   Supervising Provider    Answer:   Quentin AngstJEGEDE, OLUGBEMIGA E [id number]]  . ibuprofen (ADVIL,MOTRIN) 600 MG tablet    Sig: Take 1 tablet (600 mg total) by mouth every 8 (eight) hours as needed for moderate pain or cramping (Take with food.).    Dispense:  30 tablet    Refill:  1    Order Specific Question:   Supervising Provider    Answer:   Quentin AngstJEGEDE, OLUGBEMIGA E [id number]    Follow-up: Return if symptoms worsen or fail to improve.   Lizbeth BarkMandesia R Halcyon Heck FNP

## 2016-08-14 LAB — DRUG SCREEN, URINE
Amphetamines, Urine: NEGATIVE ng/mL
BENZODIAZEPINE QUANT UR: NEGATIVE ng/mL
Barbiturate screen, urine: NEGATIVE ng/mL
Cannabinoid Quant, Ur: NEGATIVE ng/mL
Cocaine (Metab.): NEGATIVE ng/mL
OPIATE QUANT UR: NEGATIVE ng/mL
PCP QUANT UR: NEGATIVE ng/mL

## 2016-08-17 ENCOUNTER — Ambulatory Visit (INDEPENDENT_AMBULATORY_CARE_PROVIDER_SITE_OTHER): Payer: Self-pay | Admitting: Orthopaedic Surgery

## 2016-08-17 DIAGNOSIS — M5416 Radiculopathy, lumbar region: Secondary | ICD-10-CM

## 2016-08-17 NOTE — Progress Notes (Signed)
   Office Visit Note   Patient: Zachary Hester           Date of Birth: 1960-09-21           MRN: [id number] Visit Date: 08/17/2016              Requested by: Lizbeth BarkHairston, Mandesia R, FNP [address] [place], [place] [address] PCP: Lizbeth BarkHairston, Mandesia R, FNP   Assessment & Plan: Visit Diagnoses:  1. Lumbar radiculopathy     Plan: MRI of the lumbar spine to evaluate for structural abnormalities given failure of conservative treatment. Follow up after the MRI  Follow-Up Instructions: Return in about 2 weeks (around 08/31/2016).   Orders:  Orders Placed This Encounter  Procedures  . MR Lumbar Spine w/o contrast   No orders of the defined types were placed in this encounter.     Procedures: No procedures performed   Clinical Data: No additional findings.   Subjective: Chief Complaint  Patient presents with  . Lower Back - Pain    Patient is a 56 year old gentleman comes in with chronic low back pain with left leg radiculopathy for many years. He has been seen his primary care doctor since May. He states that tramadol and conservative treatment has failed. He endorses numbness and tingling in his left foot.    Review of Systems  Constitutional: Negative.   All other systems reviewed and are negative.    Objective: Vital Signs: There were no vitals taken for this visit.  Physical Exam  Constitutional: He is oriented to person, place, and time. He appears well-developed and well-nourished.  HENT:  Head: Normocephalic and atraumatic.  Eyes: Pupils are equal, round, and reactive to light.  Neck: Neck supple.  Pulmonary/Chest: Effort normal.  Abdominal: Soft.  Musculoskeletal: Normal range of motion.  Neurological: He is alert and oriented to person, place, and time.  Skin: Skin is warm.  Psychiatric: He has a normal mood and affect. His behavior is normal. Judgment and thought content normal.  Nursing note and vitals reviewed.   Ortho Exam Lumbar spine  exam in left lower extremity exam are nonfocal. He has discomfort with palpation in the lower back. Normal reflexes. Specialty Comments:  No specialty comments available.  Imaging: No results found.   PMFS History: Patient Active Problem List   Diagnosis Date Noted  . Chronic bilateral low back pain with left-sided sciatica 06/28/2016  . Hypercholesteremia 06/21/2016  . Vitamin D deficiency 06/21/2016  . Heartburn 06/21/2016  . Heme positive stool 06/15/2016  . Generalized abdominal pain 06/15/2016  . Chronic back pain    Past Medical History:  Diagnosis Date  . Back pain   . Chronic back pain   . HTN (hypertension)   . Hypercholesteremia     Family History  Problem Relation Age of Onset  . Hypertension Mother   . Heart disease Sister   . Diabetes Sister     Past Surgical History:  Procedure Laterality Date  . NO PAST SURGERIES     Social History   Occupational History  . [employer]    Social History Main Topics  . Smoking status: Never Smoker  . Smokeless tobacco: Never Used  . Alcohol use No  . Drug use: No  . Sexual activity: Not on file

## 2016-08-19 ENCOUNTER — Telehealth: Payer: Self-pay

## 2016-08-19 NOTE — Telephone Encounter (Signed)
CMA call regarding lab results   Patient Verify DOB   Patient was aware and understood  

## 2016-08-19 NOTE — Telephone Encounter (Signed)
-----   Message from Lizbeth BarkMandesia R Hairston, FNP sent at 08/18/2016  2:19 PM EDT ----- Urine drug screen negative.

## 2016-08-31 ENCOUNTER — Ambulatory Visit (INDEPENDENT_AMBULATORY_CARE_PROVIDER_SITE_OTHER): Payer: Medicaid Other | Admitting: Orthopaedic Surgery

## 2016-09-05 ENCOUNTER — Ambulatory Visit
Admission: RE | Admit: 2016-09-05 | Discharge: 2016-09-05 | Disposition: A | Discharge status: Home / Self Care | Payer: No Typology Code available for payment source | Source: Ambulatory Visit | Attending: Orthopaedic Surgery | Admitting: Orthopaedic Surgery

## 2016-09-05 DIAGNOSIS — M5416 Radiculopathy, lumbar region: Secondary | ICD-10-CM

## 2016-09-09 ENCOUNTER — Ambulatory Visit (INDEPENDENT_AMBULATORY_CARE_PROVIDER_SITE_OTHER): Payer: Medicaid Other | Admitting: Orthopaedic Surgery

## 2016-09-10 ENCOUNTER — Other Ambulatory Visit: Payer: Self-pay | Admitting: Family Medicine

## 2016-09-10 DIAGNOSIS — E7800 Pure hypercholesterolemia, unspecified: Secondary | ICD-10-CM

## 2016-09-10 DIAGNOSIS — G8929 Other chronic pain: Secondary | ICD-10-CM

## 2016-09-10 DIAGNOSIS — M5442 Lumbago with sciatica, left side: Secondary | ICD-10-CM

## 2016-09-13 ENCOUNTER — Other Ambulatory Visit: Payer: Self-pay | Admitting: Family Medicine

## 2016-09-13 ENCOUNTER — Telehealth: Payer: Self-pay | Admitting: Family Medicine

## 2016-09-13 NOTE — Telephone Encounter (Signed)
Script was printed on Friday. Prescription was given to patient today.

## 2016-09-13 NOTE — Telephone Encounter (Signed)
It appears the prescription was printed 09/10/16. Will forward to PCP.

## 2016-09-13 NOTE — Telephone Encounter (Signed)
Patient came in today requesting refill RX on Tramadol.  Per patient he called Friday to request and [facility] him no RX there for Tramadol.  Please follow up with patient

## 2016-09-15 ENCOUNTER — Encounter: Payer: Self-pay | Admitting: Family Medicine

## 2016-09-15 ENCOUNTER — Ambulatory Visit: Payer: Self-pay | Attending: Family Medicine | Admitting: Family Medicine

## 2016-09-15 VITALS — BP 130/79 | HR 66 | Temp 98.2°F | Resp 18 | Ht 73.0 in | Wt 197.8 lb

## 2016-09-15 DIAGNOSIS — L237 Allergic contact dermatitis due to plants, except food: Secondary | ICD-10-CM | POA: Insufficient documentation

## 2016-09-15 DIAGNOSIS — L255 Unspecified contact dermatitis due to plants, except food: Secondary | ICD-10-CM

## 2016-09-15 MED ORDER — PREDNISONE 10 MG PO TABS: ORAL_TABLET | ORAL | 0 refills | Status: DC

## 2016-09-15 MED ORDER — TRIAMCINOLONE ACETONIDE 0.5 % EX CREA: 1.0000 "application " | TOPICAL_CREAM | Freq: Two times a day (BID) | CUTANEOUS | 0 refills | Status: DC

## 2016-09-15 MED ORDER — DIPHENHYDRAMINE HCL 25 MG PO CAPS: 25.0000 mg | ORAL_CAPSULE | Freq: Four times a day (QID) | ORAL | 0 refills | Status: DC | PRN

## 2016-09-15 MED ORDER — COLLOIDAL OATMEAL BATH EX PACK: 1.0000 | PACK | Freq: Every day | CUTANEOUS | 0 refills | Status: DC

## 2016-09-15 NOTE — Patient Instructions (Signed)
Poison Ivy Dermatitis Poison ivy dermatitis is inflammation of the skin that is caused by the allergens on the leaves of the poison ivy plant. The skin reaction often involves redness, swelling, blisters, and extreme itching. What are the causes? This condition is caused by a specific chemical (urushiol) found in the sap of the poison ivy plant. This chemical is sticky and can be easily spread to people, animals, and objects. You can get poison ivy dermatitis by:  Having direct contact with a poison ivy plant.  Touching animals, other people, or objects that have come in contact with poison ivy and have the chemical on them.  What increases the risk? This condition is more likely to develop in:  People who are outdoors often.  People who go outdoors without wearing protective clothing, such as closed shoes, long pants, and a long-sleeved shirt.  What are the signs or symptoms? Symptoms of this condition include:  Redness and itching.  A rash that often includes bumps and blisters. The rash usually appears 48 hours after exposure.  Swelling. This may occur if the reaction is more severe.  Symptoms usually last for 1-2 weeks. However, the first time you develop this condition, symptoms may last 3-4 weeks. How is this diagnosed? This condition may be diagnosed based on your symptoms and a physical exam. Your health care provider may also ask you about any recent outdoor activity. How is this treated? Treatment for this condition will vary depending on how severe it is. Treatment may include:  Hydrocortisone creams or calamine lotions to relieve itching.  Oatmeal baths to soothe the skin.  Over-the-counter antihistamine tablets.  Oral steroid medicine for more severe outbreaks.  Follow these instructions at home:  Take or apply over-the-counter and prescription medicines only as told by your health care provider.  Wash exposed skin as soon as possible with soap and cold  water.  Use hydrocortisone creams or calamine lotion as needed to soothe the skin and relieve itching.  Take oatmeal baths as needed. Use colloidal oatmeal. You can get this at your local pharmacy or grocery store. Follow the instructions on the packaging.  Do not scratch or rub your skin.  While you have the rash, wash clothes right after you wear them. How is this prevented?  Learn to identify the poison ivy plant and avoid contact with the plant. This plant can be recognized by the number of leaves. Generally, poison ivy has three leaves with flowering branches on a single stem. The leaves are typically glossy, and they have jagged edges that come to a point at the front.  If you have been exposed to poison ivy, thoroughly wash with soap and water right away. You have about 30 minutes to remove the plant resin before it will cause the rash. Be sure to wash under your fingernails because any plant resin there will continue to spread the rash.  When hiking or camping, wear clothes that will help you to avoid exposure on the skin. This includes long pants, a long-sleeved shirt, tall socks, and hiking boots. You can also apply preventive lotion to your skin to help limit exposure.  If you suspect that your clothes or outdoor gear came in contact with poison ivy, rinse them off outside with a garden hose before you bring them inside your house. Contact a health care provider if:  You have open sores in the rash area.  You have more redness, swelling, or pain in the affected area.  You have   redness that spreads beyond the rash area.  You have fluid, blood, or pus coming from the affected area.  You have a fever.  You have a rash over a large area of your body.  You have a rash on your eyes, mouth, or genitals.  Your rash does not improve after a few days. Get help right away if:  Your face swells or your eyes swell shut.  You have trouble breathing.  You have trouble  swallowing. This information is not intended to replace advice given to you by your health care provider. Make sure you discuss any questions you have with your health care provider. Document Released: 01/23/2000 Document Revised: 07/03/2015 Document Reviewed: 07/03/2014 Elsevier Interactive Patient Education  2018 [employer] Inc[employer]  

## 2016-09-15 NOTE — Progress Notes (Signed)
Subjective:  Patient ID: Zachary Hester, male    DOB: 1960-12-25  Age: 56 y.o. MRN: [id number]  CC: Establish Care   HPI Zachary Hester presents for complains of rash involving the bilateral arm, neck and torso. Rash started 4 days ago. Appearance of rash at onset:  Texture of lesion(s): raised. Rash has not changed over time. Discomfort associated with rash: is pruritic.  Associated symptoms: none. Denies: abdominal pain, fever and malaise. Patient has not had previous evaluation of rash. Patient has not had previous treatment. Patient has not had contacts with similar rash. Patient reports performing yard work 4 days ago when symptoms began. He reports new exposures plants in his yard.     Outpatient Medications Prior to Visit  Medication Sig Dispense Refill  . atorvastatin (LIPITOR) 20 MG tablet TAKE 1 TABLET BY MOUTH DAILY. 30 tablet 2  . Ergocalciferol (VITAMIN D2 PO) Take 1 tablet by mouth daily.    Marland Kitchen ibuprofen (ADVIL,MOTRIN) 600 MG tablet Take 1 tablet (600 mg total) by mouth every 8 (eight) hours as needed for moderate pain or cramping (Take with food.). 30 tablet 1  . ranitidine (ZANTAC) 150 MG tablet Take 1 tablet (150 mg total) by mouth 2 (two) times daily as needed for heartburn. 30 tablet 0  . traMADol (ULTRAM) 50 MG tablet TAKE 1 TABLET BY MOUTH EVERY 12 HOURS AS NEEDED FOR SEVERE PAIN 60 tablet 0  . 0.9 %  sodium chloride infusion     . 0.9 %  sodium chloride infusion      No facility-administered medications prior to visit.     ROS Review of Systems  Constitutional: Negative.   Respiratory: Negative.   Cardiovascular: Negative.   Skin: Positive for rash.   Objective:  BP 130/79 (BP Location: Left Arm, Patient Position: Sitting, Cuff Size: Normal)   Pulse 66   Temp 98.2 F (36.8 C) (Oral)   Resp 18   Ht 6\' 1"  (1.854 m)   Wt 197 lb 12.8 oz (89.7 kg)   SpO2 99%   BMI 26.10 kg/m   BP/Weight 09/15/2016 08/13/2016 07/08/2016  Systolic BP 130 125 112    Diastolic BP 79 77 72  Wt. (Lbs) 197.8 196.6 -  BMI 26.1 25.94 -   Physical Exam  Constitutional: He appears well-developed and well-nourished.  Eyes: Pupils are equal, round, and reactive to light. Conjunctivae are normal.  Neck: No JVD present.  Cardiovascular: Normal rate, regular rhythm, normal heart sounds and intact distal pulses.   Pulmonary/Chest: Effort normal and breath sounds normal.  Skin: Skin is warm and dry. Rash (pruritic, maculopapular rash to the bilateral BUE, neck, chest, and posterior back) noted.  Psychiatric: He has a normal mood and affect.  Nursing note and vitals reviewed.   Assessment & Plan:   Problem List Items Addressed This Visit    None    Visit Diagnoses    Dermatitis due to plants, including poison ivy, sumac, and oak    -  Primary   Relevant Medications   predniSONE (DELTASONE) 10 MG tablet   colloidal oatmeal PACK   triamcinolone cream (KENALOG) 0.5 %   diphenhydrAMINE (BENADRYL) 25 mg capsule      Meds ordered this encounter  Medications  . predniSONE (DELTASONE) 10 MG tablet    Sig: DAY 1: TAKE 6 TABLETS BY MOUTH WITH MEAL; DAY 2: TAKE 5 TABLETS; DAY 3 TAKE 4 TABLETS; DAY 4 TAKE 3 TABLETS; DAY 5 TAKE 2 TABLETS; DAY 6 TAKE  1 TABLET.    Dispense:  21 tablet    Refill:  0    Order Specific Question:   Supervising Provider    Answer:   Quentin AngstJEGEDE, OLUGBEMIGA E [id number]]  . colloidal oatmeal PACK    Sig: Apply 1 each topically daily. Daily for 14 days.    Dispense:  14 packet    Refill:  0    Order Specific Question:   Supervising Provider    Answer:   Quentin AngstJEGEDE, OLUGBEMIGA E [id number]]  . triamcinolone cream (KENALOG) 0.5 %    Sig: Apply 1 application topically 2 (two) times daily. Apply to affected areas as needed for 14 days.    Dispense:  30 g    Refill:  0    Order Specific Question:   Supervising Provider    Answer:   Quentin AngstJEGEDE, OLUGBEMIGA E [id number]]  . diphenhydrAMINE (BENADRYL) 25 mg capsule    Sig: Take 1 capsule (25 mg total) by  mouth every 6 (six) hours as needed for itching.    Dispense:  30 capsule    Refill:  0    Order Specific Question:   Supervising Provider    Answer:   Quentin AngstJEGEDE, OLUGBEMIGA E [id number]    Follow-up: Return if symptoms worsen or fail to improve.   Lizbeth BarkMandesia R Shyla Gayheart FNP

## 2016-09-15 NOTE — Progress Notes (Signed)
Patient is here for rashes &  itching & burning on his rams stomach and neck

## 2016-09-30 ENCOUNTER — Encounter: Payer: Self-pay | Admitting: Family Medicine

## 2016-09-30 ENCOUNTER — Ambulatory Visit: Payer: Self-pay | Attending: Family Medicine | Admitting: Family Medicine

## 2016-09-30 VITALS — BP 122/76 | HR 73 | Temp 98.4°F | Resp 18 | Ht 73.0 in | Wt 196.6 lb

## 2016-09-30 DIAGNOSIS — L237 Allergic contact dermatitis due to plants, except food: Secondary | ICD-10-CM | POA: Insufficient documentation

## 2016-09-30 DIAGNOSIS — L255 Unspecified contact dermatitis due to plants, except food: Secondary | ICD-10-CM

## 2016-09-30 MED ORDER — PREDNISONE 10 MG PO TABS: ORAL_TABLET | ORAL | 0 refills | Status: DC

## 2016-09-30 MED ORDER — HYDROCHLOROTHIAZIDE 12.5 MG PO TABS: 12.5000 mg | ORAL_TABLET | Freq: Every day | ORAL | 2 refills | Status: DC

## 2016-09-30 MED ORDER — DIPHENHYDRAMINE HCL 25 MG PO CAPS: 25.0000 mg | ORAL_CAPSULE | Freq: Four times a day (QID) | ORAL | 0 refills | Status: DC | PRN

## 2016-09-30 MED ORDER — TRIAMCINOLONE ACETONIDE 0.5 % EX CREA: 1.0000 "application " | TOPICAL_CREAM | Freq: Two times a day (BID) | CUTANEOUS | 0 refills | Status: DC

## 2016-09-30 MED ORDER — COLLOIDAL OATMEAL BATH EX PACK: 1.0000 | PACK | Freq: Every day | CUTANEOUS | 0 refills | Status: DC

## 2016-09-30 NOTE — Progress Notes (Signed)
Patient is here for rashes & itching on arm

## 2016-09-30 NOTE — Patient Instructions (Addendum)
Follow up information for orthopedics: [facility] [place] [phone]  Hypertension Hypertension is another name for high blood pressure. High blood pressure forces your heart to work harder to pump blood. This can cause problems over time. There are two numbers in a blood pressure reading. There is a top number (systolic) over a bottom number (diastolic). It is best to have a blood pressure below 120/80. Healthy choices can help lower your blood pressure. You may need medicine to help lower your blood pressure if:  Your blood pressure cannot be lowered with healthy choices.  Your blood pressure is higher than 130/80.  Follow these instructions at home: Eating and drinking  If directed, follow the DASH eating plan. This diet includes: ? Filling half of your plate at each meal with fruits and vegetables. ? Filling one quarter of your plate at each meal with whole grains. Whole grains include whole wheat pasta, brown rice, and whole grain bread. ? Eating or drinking low-fat dairy products, such as skim milk or low-fat yogurt. ? Filling one quarter of your plate at each meal with low-fat (lean) proteins. Low-fat proteins include fish, skinless chicken, eggs, beans, and tofu. ? Avoiding fatty meat, cured and processed meat, or chicken with skin. ? Avoiding premade or processed food.  Eat less than 1,500 mg of salt (sodium) a day.  Limit alcohol use to no more than 1 drink a day for nonpregnant women and 2 drinks a day for men. One drink equals 12 oz of beer, 5 oz of wine, or 1 oz of hard liquor. Lifestyle  Work with your doctor to stay at a healthy weight or to lose weight. Ask your doctor what the best weight is for you.  Get at least 30 minutes of exercise that causes your heart to beat faster (aerobic exercise) most days of the week. This may include walking, swimming, or biking.  Get at least 30 minutes of exercise that strengthens your muscles (resistance exercise)  at least 3 days a week. This may include lifting weights or pilates.  Do not use any products that contain nicotine or tobacco. This includes cigarettes and e-cigarettes. If you need help quitting, ask your doctor.  Check your blood pressure at home as told by your doctor.  Keep all follow-up visits as told by your doctor. This is important. Medicines  Take over-the-counter and prescription medicines only as told by your doctor. Follow directions carefully.  Do not skip doses of blood pressure medicine. The medicine does not work as well if you skip doses. Skipping doses also puts you at risk for problems.  Ask your doctor about side effects or reactions to medicines that you should watch for. Contact a doctor if:  You think you are having a reaction to the medicine you are taking.  You have headaches that keep coming back (recurring).  You feel dizzy.  You have swelling in your ankles.  You have trouble with your vision. Get help right away if:  You get a very bad headache.  You start to feel confused.  You feel weak or numb.  You feel faint.  You get very bad pain in your: ? Chest. ? Belly (abdomen).  You throw up (vomit) more than once.  You have trouble breathing. Summary  Hypertension is another name for high blood pressure.  Making healthy choices can help lower blood pressure. If your blood pressure cannot be controlled with healthy choices, you may need to take medicine. This information is not  intended to replace advice given to you by your health care provider. Make sure you discuss any questions you have with your health care provider. Document Released: 07/14/2007 Document Revised: 12/24/2015 Document Reviewed: 12/24/2015 Elsevier Interactive Patient Education  2018 [employer].    Poison Ivy Dermatitis Poison ivy dermatitis is redness and soreness (inflammation) of the skin. It is caused by a chemical that is found on the leaves of the poison ivy  plant. You may also have itching, a rash, and blisters. Symptoms often clear up in 1-2 weeks. You may get this condition by touching a poison ivy plant. You can also get it by touching something that has the chemical on it. This may include animals or objects that have come in contact with the plant. Follow these instructions at home: General instructions  Take or apply over-the-counter and prescription medicines only as told by your doctor.  If you touch poison ivy, wash your skin with soap and cold water right away.  Use hydrocortisone creams or calamine lotion as needed to help with itching.  Take oatmeal baths as needed. Use colloidal oatmeal. You can get this at a pharmacy or grocery store. Follow the instructions on the package.  Do not scratch or rub your skin.  While you have the rash, wash your clothes right after you wear them. Prevention  Know what poison ivy looks like so you can avoid it. This plant has three leaves with flowering branches on a single stem. The leaves are glossy. They have uneven edges that come to a point at the front.  If you have touched poison ivy, wash with soap and water right away. Be sure to wash under your fingernails.  When hiking or camping, wear long pants, a long-sleeved shirt, tall socks, and hiking boots. You can also use a lotion on your skin that helps to prevent contact with the chemical on the plant.  If you think that your clothes or outdoor gear came in contact with poison ivy, rinse them off with a garden hose before you bring them inside your house. Contact a doctor if:  You have open sores in the rash area.  You have more redness, swelling, or pain in the affected area.  You have redness that spreads beyond the rash area.  You have fluid, blood, or pus coming from the affected area.  You have a fever.  You have a rash over a large area of your body.  You have a rash on your eyes, mouth, or genitals.  Your rash does not get  better after a few days. Get help right away if:  Your face swells or your eyes swell shut.  You have trouble breathing.  You have trouble swallowing. This information is not intended to replace advice given to you by your health care provider. Make sure you discuss any questions you have with your health care provider. Document Released: 02/27/2010 Document Revised: 07/03/2015 Document Reviewed: 07/03/2014 Elsevier Interactive Patient Education  [employer].

## 2016-09-30 NOTE — Progress Notes (Signed)
Subjective:  Patient ID: Zachary Hester, male    DOB: 1960-04-21  Age: 56 y.o. MRN: [id number]  CC: Establish Care   HPI Harvy A Staff presents for complains of rash involving the bilateral arms and hand. Rash started 2 days ago. Appearance of rash at onset:  Texture of lesion(s): raised. Rash has not changed over time. Discomfort associated with rash: is pruritic.  Associated symptoms: none. Denies: abdominal pain, fever and malaise. He reports recent exposure to plants again after doing yard work 2 days ago. Patient reports relief of symptoms with previous treatment. Patient has not had contacts with similar rash.   Outpatient Medications Prior to Visit  Medication Sig Dispense Refill  . atorvastatin (LIPITOR) 20 MG tablet TAKE 1 TABLET BY MOUTH DAILY. 30 tablet 2  . Ergocalciferol (VITAMIN D2 PO) Take 1 tablet by mouth daily.    Marland Kitchen ibuprofen (ADVIL,MOTRIN) 600 MG tablet Take 1 tablet (600 mg total) by mouth every 8 (eight) hours as needed for moderate pain or cramping (Take with food.). 30 tablet 1  . ranitidine (ZANTAC) 150 MG tablet Take 1 tablet (150 mg total) by mouth 2 (two) times daily as needed for heartburn. 30 tablet 0  . traMADol (ULTRAM) 50 MG tablet TAKE 1 TABLET BY MOUTH EVERY 12 HOURS AS NEEDED FOR SEVERE PAIN 60 tablet 0  . colloidal oatmeal PACK Apply 1 each topically daily. Daily for 14 days. 14 packet 0  . diphenhydrAMINE (BENADRYL) 25 mg capsule Take 1 capsule (25 mg total) by mouth every 6 (six) hours as needed for itching. 30 capsule 0  . predniSONE (DELTASONE) 10 MG tablet DAY 1: TAKE 6 TABLETS BY MOUTH WITH MEAL; DAY 2: TAKE 5 TABLETS; DAY 3 TAKE 4 TABLETS; DAY 4 TAKE 3 TABLETS; DAY 5 TAKE 2 TABLETS; DAY 6 TAKE 1 TABLET. 21 tablet 0  . triamcinolone cream (KENALOG) 0.5 % Apply 1 application topically 2 (two) times daily. Apply to affected areas as needed for 14 days. 30 g 0   No facility-administered medications prior to visit.     ROS Review of Systems     Constitutional: Negative.   Eyes: Negative.   Respiratory: Negative.   Cardiovascular: Negative.   Gastrointestinal: Negative.   Skin: rash     Objective:  BP 122/76   Pulse 73   Temp 98.4 F (36.9 C) (Oral)   Resp 18   Ht 6\' 1"  (1.854 m)   Wt 196 lb 9.6 oz (89.2 kg)   SpO2 100%   BMI 25.94 kg/m   BP/Weight 09/30/2016 09/15/2016 08/13/2016  Systolic BP 122 130 125  Diastolic BP 76 79 77  Wt. (Lbs) 196.6 197.8 196.6  BMI 25.94 26.1 25.94     Physical Exam  Constitutional: He appears well-developed and well-nourished.  Eyes: Pupils are equal, round, and reactive to light. Conjunctivae are normal.  Neck: No JVD present.  Cardiovascular: Normal rate, regular rhythm, normal heart sounds and intact distal pulses.   Pulmonary/Chest: Effort normal and breath sounds normal.  Skin: Skin is warm and dry. Rash (pruritic, maculopapular rash to the bilateral BUE) noted.  Psychiatric: He has a normal mood and affect.  Nursing note and vitals reviewed.   Assessment & Plan:   Problem List Items Addressed This Visit    None    Visit Diagnoses    Dermatitis due to plants, including poison ivy, sumac, and oak    -  Primary   Relevant Medications   diphenhydrAMINE (BENADRYL) 25 mg  capsule   triamcinolone cream (KENALOG) 0.5 %   predniSONE (DELTASONE) 10 MG tablet   colloidal oatmeal PACK      Meds ordered this encounter  Medications  . diphenhydrAMINE (BENADRYL) 25 mg capsule    Sig: Take 1 capsule (25 mg total) by mouth every 6 (six) hours as needed for itching.    Dispense:  30 capsule    Refill:  0    Order Specific Question:   Supervising Provider    Answer:   Quentin Angst [id number]  . triamcinolone cream (KENALOG) 0.5 %    Sig: Apply 1 application topically 2 (two) times daily. Apply to affected areas as needed for 14 days.    Dispense:  30 g    Refill:  0    Order Specific Question:   Supervising Provider    Answer:   Quentin Angst [id number]  .  predniSONE (DELTASONE) 10 MG tablet    Sig: DAY 1: TAKE 6 TABLETS BY MOUTH WITH MEAL; DAY 2: TAKE 5 TABLETS; DAY 3 TAKE 4 TABLETS; DAY 4 TAKE 3 TABLETS; DAY 5 TAKE 2 TABLETS; DAY 6 TAKE 1 TABLET.    Dispense:  21 tablet    Refill:  0    Order Specific Question:   Supervising Provider    Answer:   Quentin Angst [id number]  . colloidal oatmeal PACK    Sig: Apply 1 each topically daily. Daily for 14 days.    Dispense:  14 packet    Refill:  0    Order Specific Question:   Supervising Provider    Answer:   Quentin Angst [id number]  . DISCONTD: hydrochlorothiazide (HYDRODIURIL) 12.5 MG tablet    Sig: Take 1 tablet (12.5 mg total) by mouth daily.    Dispense:  30 tablet    Refill:  2    Order Specific Question:   Supervising Provider    Answer:   Quentin Angst [id number]    Follow-up: Return in about 2 weeks (around 10/14/2016), or if symptoms worsen or fail to improve.   Lizbeth Bark FNP

## 2016-10-05 ENCOUNTER — Telehealth (INDEPENDENT_AMBULATORY_CARE_PROVIDER_SITE_OTHER): Payer: Self-pay | Admitting: Physical Medicine and Rehabilitation

## 2016-10-05 DIAGNOSIS — G8929 Other chronic pain: Secondary | ICD-10-CM

## 2016-10-05 DIAGNOSIS — M5442 Lumbago with sciatica, left side: Principal | ICD-10-CM

## 2016-10-05 NOTE — Telephone Encounter (Signed)
Patient came in wanting to schedule the lower back injection. Cb # [phone]

## 2016-10-05 NOTE — Telephone Encounter (Signed)
Please advise see message below

## 2016-10-05 NOTE — Telephone Encounter (Signed)
Left L4 TF esi needed, but run by Roda Shutters for referral if needed

## 2016-10-05 NOTE — Telephone Encounter (Signed)
Please advise. I see no record of his having seen you in the past. He has been seen by Dr. Roda Shutters recently and had an MRI., but no referral.

## 2016-10-05 NOTE — Addendum Note (Signed)
Addended by: Albertina Parr on: 10/05/2016 04:54 PM   Modules accepted: Orders

## 2016-10-05 NOTE — Telephone Encounter (Signed)
Ok approve

## 2016-10-05 NOTE — Telephone Encounter (Signed)
ORDER MADE 

## 2016-10-05 NOTE — Telephone Encounter (Signed)
See below. Need referral if this is what Dr. Roda Shutters wants.

## 2016-10-14 ENCOUNTER — Ambulatory Visit: Payer: Self-pay | Admitting: Family Medicine

## 2016-10-15 ENCOUNTER — Telehealth: Payer: Self-pay | Admitting: Family Medicine

## 2016-10-15 NOTE — Telephone Encounter (Signed)
Pt called requesting medication refill on traMADol (ULTRAM) 50 MG tablet °

## 2016-10-18 ENCOUNTER — Other Ambulatory Visit: Payer: Self-pay | Admitting: Family Medicine

## 2016-10-18 ENCOUNTER — Encounter (INDEPENDENT_AMBULATORY_CARE_PROVIDER_SITE_OTHER): Payer: Self-pay

## 2016-10-18 ENCOUNTER — Ambulatory Visit (INDEPENDENT_AMBULATORY_CARE_PROVIDER_SITE_OTHER): Payer: Self-pay

## 2016-10-18 ENCOUNTER — Encounter (INDEPENDENT_AMBULATORY_CARE_PROVIDER_SITE_OTHER): Payer: Self-pay | Admitting: Physical Medicine and Rehabilitation

## 2016-10-18 ENCOUNTER — Ambulatory Visit (INDEPENDENT_AMBULATORY_CARE_PROVIDER_SITE_OTHER): Payer: Self-pay | Admitting: Physical Medicine and Rehabilitation

## 2016-10-18 ENCOUNTER — Telehealth: Payer: Self-pay | Admitting: Family Medicine

## 2016-10-18 VITALS — BP 137/89 | HR 79

## 2016-10-18 DIAGNOSIS — M5416 Radiculopathy, lumbar region: Secondary | ICD-10-CM

## 2016-10-18 DIAGNOSIS — M4316 Spondylolisthesis, lumbar region: Secondary | ICD-10-CM

## 2016-10-18 DIAGNOSIS — M48062 Spinal stenosis, lumbar region with neurogenic claudication: Secondary | ICD-10-CM

## 2016-10-18 DIAGNOSIS — G8929 Other chronic pain: Secondary | ICD-10-CM

## 2016-10-18 DIAGNOSIS — M5442 Lumbago with sciatica, left side: Principal | ICD-10-CM

## 2016-10-18 MED ORDER — TRAMADOL HCL 50 MG PO TABS: ORAL_TABLET | ORAL | 0 refills | Status: DC

## 2016-10-18 MED ORDER — LIDOCAINE HCL (PF) 1 % IJ SOLN
2.0000 mL | Freq: Once | INTRAMUSCULAR | Status: AC
  Administered 2016-10-18: 2 mL

## 2016-10-18 MED ORDER — BETAMETHASONE SOD PHOS & ACET 6 (3-3) MG/ML IJ SUSP
12.0000 mg | Freq: Once | INTRAMUSCULAR | Status: AC
  Administered 2016-10-18: 12 mg

## 2016-10-18 MED ORDER — IBUPROFEN 600 MG PO TABS: 600.0000 mg | ORAL_TABLET | Freq: Three times a day (TID) | ORAL | 1 refills | Status: DC | PRN

## 2016-10-18 NOTE — Progress Notes (Deleted)
Left side low back pain. Pain down left leg to foot. Numbness and tingling in foot. Pain is worse with standing and walking. Ok with sitting. S1 sxs

## 2016-10-18 NOTE — Procedures (Signed)
Lumbosacral Transforaminal Epidural Steroid Injection - Sub-Pedicular Approach with Fluoroscopic Guidance  Patient: Glade LloydAltayeib A Tomko      Date of Birth: December 29, 1960 MRN: [id number] PCP: Lizbeth BarkHairston, Mandesia R, FNP      Visit Date: 10/18/2016   Universal Protocol:    Date/Time: 10/18/2016  Consent Given By: the patient  Position: PRONE  Additional Comments: Vital signs were monitored before and after the procedure. Patient was prepped and draped in the usual sterile fashion. The correct patient, procedure, and site was verified.   Injection Procedure Details:  Procedure Site One Meds Administered:  Meds ordered this encounter  Medications  . lidocaine (PF) (XYLOCAINE) 1 % injection 2 mL  . betamethasone acetate-betamethasone sodium phosphate (CELESTONE) injection 12 mg    Laterality: Left  Location/Site:  L4-L5  Needle size: 22 G  Needle type: Spinal  Needle Placement: Transforaminal  Findings:  -Contrast Used: 1 mL iohexol 180 mg iodine/mL   -Comments: Excellent flow of contrast along the nerve and into the epidural space.  Procedure Details: After squaring off the end-plates to get a true AP view, the C-arm was positioned so that an oblique view of the foramen as noted above was visualized. The target area is just inferior to the "nose of the scotty dog" or sub pedicular. The soft tissues overlying this structure were infiltrated with 2-3 ml. of 1% Lidocaine without Epinephrine.  The spinal needle was inserted toward the target using a "trajectory" view along the fluoroscope beam.  Under AP and lateral visualization, the needle was advanced so it did not puncture dura and was located close the 6 O'Clock position of the pedical in AP tracterory. Biplanar projections were used to confirm position. Aspiration was confirmed to be negative for CSF and/or blood. A 1-2 ml. volume of Isovue-250 was injected and flow of contrast was noted at each level. Radiographs were obtained  for documentation purposes.   After attaining the desired flow of contrast documented above, a 0.5 to 1.0 ml test dose of 0.25% Marcaine was injected into each respective transforaminal space.  The patient was observed for 90 seconds post injection.  After no sensory deficits were reported, and normal lower extremity motor function was noted,   the above injectate was administered so that equal amounts of the injectate were placed at each foramen (level) into the transforaminal epidural space.   Additional Comments:  The patient tolerated the procedure well Dressing: Band-Aid    Post-procedure details: Patient was observed during the procedure. Post-procedure instructions were reviewed.  Patient left the clinic in stable condition.

## 2016-10-18 NOTE — Telephone Encounter (Signed)
atorvastatin last filled on 09/10/16 with 2 additional refills to total 90 day supply. Too soon to refill. Will refill ibuprofen 600 mg and tramadol 50 mg.

## 2016-10-18 NOTE — Telephone Encounter (Signed)
CMA call regarding tramadol prescription ready for pick up   Patient was aware and understood

## 2016-10-18 NOTE — Telephone Encounter (Signed)
Pt came to the office requesting a refill for  atorvastatin (LIPITOR) 20 MG tablet  ibuprofen (ADVIL,MOTRIN) 600 MG tablet   Please follow up

## 2016-10-18 NOTE — Progress Notes (Signed)
Zachary LEGER - 56 y.o. male MRN [id number]  Date of birth: March 17, 1960  Office Visit Note: Visit Date: 10/18/2016 PCP: Lizbeth Bark, FNP Referred by: Thomas Hoff*  Subjective: Chief Complaint  Patient presents with  . Lower Back - Pain   HPI: Zachary Hester is a 56 year old gentleman with chronic history of back pain with radicular type leg pain over many years. He's had prior epidural injections that have really been helpful in terms of pain relief. Most recently he's been followed by Dr. Roda Shutters in our office. He has had an MRI of the lumbar spine performed recently at the end of July. 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 He also has a 5 mm retrolisthesis of L5 on S1 with significant straightening of the normal lordotic curvature. He has some endplate edema as well. He has failed conservative care including medication management and activity modification. His pain symptoms are and a pretty classic S1 distribution down the posterior aspect of the left leg into the side of the foot with numbness and paresthesias. He says these okay with sitting but he cannot stand or walk very long because the pain and worsening. He continues to work and has a lot of working standing up and moving. He tries to suggest that standing and walking on the leg is worse from a pathology standpoint. We did have a long discussion about activity and how it relates to physical changes of the spine. He's had no right-sided complaints or focal weakness.    Review of Systems  Constitutional: Negative for chills, fever, malaise/fatigue and weight loss.  HENT: Negative for hearing loss and sinus pain.   Eyes: Negative for blurred vision, double vision and photophobia.  Respiratory: Negative for cough and shortness of breath.   Cardiovascular: Negative for chest pain, palpitations and leg swelling.  Gastrointestinal: Negative for  abdominal pain, nausea and vomiting.  Genitourinary: Negative for flank pain.  Musculoskeletal: Positive for back pain. Negative for myalgias.       Left leg pain  Skin: Negative for itching and rash.  Neurological: Positive for tingling. Negative for tremors, focal weakness and weakness.  Endo/Heme/Allergies: Negative.   Psychiatric/Behavioral: Negative for depression.  All other systems reviewed and are negative.  Otherwise per HPI.  Assessment & Plan: Visit Diagnoses:  1. Lumbar radiculopathy   2. Spinal stenosis of lumbar region with neurogenic claudication   3. Spondylolisthesis of lumbar region     Plan: Findings:  Chronic worsening severe S1 radicular type leg pain with low back pain worse with standing and better at rest and sitting. He has severe stenosis multifactorial 4-5 and also listhesis of L5 on S1 with some congenital stenosis here as well. I think diagnostically the best approach is left L4 transforaminal injection. If he does well with this we can continue to watch him along with Dr. Roda Shutters. He would be a good surgical candidate for decompression at this level. That would be a fairly minimal surgery for did not have to correct any curvature other problems. There is no listhesis at this level but there is at the level below. If he doesn't get relief we are going to have him come back in 2 weeks for possible S1 transforaminal injection diagnostically. He continue with exercises he can continue to work. We talked about activity modification and heavy lifting. Injection was completed today due to the severity of his symptoms.    Meds &  Orders:  Meds ordered this encounter  Medications  . lidocaine (PF) (XYLOCAINE) 1 % injection 2 mL  . betamethasone acetate-betamethasone sodium phosphate (CELESTONE) injection 12 mg    Orders Placed This Encounter  Procedures  . XR C-ARM NO REPORT  . Epidural Steroid injection    Follow-up: Return in about 2 weeks (around 11/01/2016), or if  symptoms worsen or fail to improve, for Left S1 Transforaminal epidural injection.   Procedures: No procedures performed  Lumbosacral Transforaminal Epidural Steroid Injection - Sub-Pedicular Approach with Fluoroscopic Guidance  Patient: Zachary Hester      Date of Birth: 1960-10-13 MRN: [id number] PCP: Lizbeth BarkHairston, Mandesia R, FNP      Visit Date: 10/18/2016   Universal Protocol:    Date/Time: 10/18/2016  Consent Given By: the patient  Position: PRONE  Additional Comments: Vital signs were monitored before and after the procedure. Patient was prepped and draped in the usual sterile fashion. The correct patient, procedure, and site was verified.   Injection Procedure Details:  Procedure Site One Meds Administered:  Meds ordered this encounter  Medications  . lidocaine (PF) (XYLOCAINE) 1 % injection 2 mL  . betamethasone acetate-betamethasone sodium phosphate (CELESTONE) injection 12 mg    Laterality: Left  Location/Site:  L4-L5  Needle size: 22 G  Needle type: Spinal  Needle Placement: Transforaminal  Findings:  -Contrast Used: 1 mL iohexol 180 mg iodine/mL   -Comments: Excellent flow of contrast along the nerve and into the epidural space.  Procedure Details: After squaring off the end-plates to get a true AP view, the C-arm was positioned so that an oblique view of the foramen as noted above was visualized. The target area is just inferior to the "nose of the scotty dog" or sub pedicular. The soft tissues overlying this structure were infiltrated with 2-3 ml. of 1% Lidocaine without Epinephrine.  The spinal needle was inserted toward the target using a "trajectory" view along the fluoroscope beam.  Under AP and lateral visualization, the needle was advanced so it did not puncture dura and was located close the 6 O'Clock position of the pedical in AP tracterory. Biplanar projections were used to confirm position. Aspiration was confirmed to be negative for CSF  and/or blood. A 1-2 ml. volume of Isovue-250 was injected and flow of contrast was noted at each level. Radiographs were obtained for documentation purposes.   After attaining the desired flow of contrast documented above, a 0.5 to 1.0 ml test dose of 0.25% Marcaine was injected into each respective transforaminal space.  The patient was observed for 90 seconds post injection.  After no sensory deficits were reported, and normal lower extremity motor function was noted,   the above injectate was administered so that equal amounts of the injectate were placed at each foramen (level) into the transforaminal epidural space.   Additional Comments:  The patient tolerated the procedure well Dressing: Band-Aid    Post-procedure details: Patient was observed during the procedure. Post-procedure instructions were reviewed.  Patient left the clinic in stable condition.        Clinical History: MRI LUMBAR SPINE WITHOUT CONTRAST  TECHNIQUE: Multiplanar, multisequence MR imaging of the lumbar spine was performed. No intravenous contrast was administered.  COMPARISON:  Lumbar spine radiographs 07/08/2016  FINDINGS: Segmentation:  Standard.  Alignment: Chronic lumbar spine straightening and 5 mm retrolisthesis of L5 on S1.  Vertebrae: No fracture or suspicious osseous lesion. Prominent degenerative endplate marrow changes at L4-5 and L5-S1. Moderate degenerative edema at L4-5.  Conus medullaris: Extends to the upper L1 level and appears normal.  Paraspinal and other soft tissues: Unremarkable.  Disc levels:  Congenitally short pedicles result in mild diffuse narrowing of the lumbar spinal canal.  T12-L1 through L3-4:  Negative.  L4-5: Circumferential disc bulging, a broad central disc protrusion with annular fissure, congenitally short pedicles, and mild-to-moderate facet and ligamentum flavum hypertrophy result in severe spinal stenosis and mild-to-moderate  bilateral neural foraminal stenosis.  L5-S1: Moderate to severe disc space narrowing. Circumferential disc bulging, broad central disc protrusion, and mild facet hypertrophy result in mild-to-moderate bilateral lateral recess stenosis, mild spinal stenosis, and mild right and mild-to-moderate left neural foraminal stenosis.  IMPRESSION: 1. 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 2. Severe spinal stenosis and mild-to-moderate neural foraminal stenosis at L4-5. 3. Mild-to-moderate lateral recess and neural foraminal stenosis at L5-S1.   Electronically Signed   By: Sebastian Ache M.D.   On: 09/05/2016 08:31  He reports that he has never smoked. He has never used smokeless tobacco. No results for input(s): HGBA1C, LABURIC in the last 8760 hours.  Objective:  VS:  HT:    WT:   BMI:     BP:137/89  HR:79bpm  TEMP: ( )  RESP:100 % Physical Exam  Constitutional: He is oriented to person, place, and time. He appears well-developed and well-nourished. No distress.  HENT:  Head: Normocephalic and atraumatic.  Eyes: Pupils are equal, round, and reactive to light. Conjunctivae are normal.  Neck: Normal range of motion. Neck supple.  Cardiovascular: Regular rhythm and intact distal pulses.   Pulmonary/Chest: Effort normal. No respiratory distress.  Musculoskeletal:  Patient ambulates without aid with a slightly forward flexed spine. He has a negative slump test bilaterally. He does have pain with extension of the lumbar spine. He has no pain with hip rotation. He has good distal strength was some decreased strength mildly with EHL on the left with good dorsiflexion plantarflexion.  Neurological: He is alert and oriented to person, place, and time.  Skin: Skin is warm and dry. No rash noted. No erythema.  Psychiatric: He has a normal mood and affect.  Nursing note and vitals reviewed.   Ortho  Exam Imaging: Xr C-arm No Report  Result Date: 10/18/2016 Please see Notes or Procedures tab for imaging impression.   Past Medical/Family/Surgical/Social History: Medications & Allergies reviewed per EMR Patient Active Problem List   Diagnosis Date Noted  . Chronic bilateral low back pain with left-sided sciatica 06/28/2016  . Hypercholesteremia 06/21/2016  . Vitamin D deficiency 06/21/2016  . Heartburn 06/21/2016  . Heme positive stool 06/15/2016  . Generalized abdominal pain 06/15/2016  . Chronic back pain    Past Medical History:  Diagnosis Date  . Back pain   . Chronic back pain   . HTN (hypertension)   . Hypercholesteremia    Family History  Problem Relation Age of Onset  . Hypertension Mother   . Heart disease Sister   . Diabetes Sister    Past Surgical History:  Procedure Laterality Date  . NO PAST SURGERIES     Social History   Occupational History  . [employer]    Social History Main Topics  . Smoking status: Never Smoker  . Smokeless tobacco: Never Used  . Alcohol use No  . Drug use: No  . Sexual activity: Not on file

## 2016-10-18 NOTE — Patient Instructions (Signed)

## 2016-10-18 NOTE — Telephone Encounter (Signed)
Pt came to the office requesting a refill for  atorvastatin (LIPITOR) 20 MG tablet  ibuprofen (ADVIL,MOTRIN) 600 MG tablet   Please advice?

## 2016-11-01 ENCOUNTER — Ambulatory Visit (INDEPENDENT_AMBULATORY_CARE_PROVIDER_SITE_OTHER): Payer: Self-pay

## 2016-11-01 ENCOUNTER — Ambulatory Visit (INDEPENDENT_AMBULATORY_CARE_PROVIDER_SITE_OTHER): Payer: Self-pay | Admitting: Physical Medicine and Rehabilitation

## 2016-11-01 VITALS — BP 137/84 | HR 75

## 2016-11-01 DIAGNOSIS — M48062 Spinal stenosis, lumbar region with neurogenic claudication: Secondary | ICD-10-CM

## 2016-11-01 DIAGNOSIS — M5416 Radiculopathy, lumbar region: Secondary | ICD-10-CM

## 2016-11-01 MED ORDER — METHYLPREDNISOLONE ACETATE 80 MG/ML IJ SUSP
80.0000 mg | Freq: Once | INTRAMUSCULAR | Status: AC
  Administered 2016-11-01: 80 mg

## 2016-11-01 MED ORDER — LIDOCAINE HCL (PF) 1 % IJ SOLN
2.0000 mL | Freq: Once | INTRAMUSCULAR | Status: AC
Start: 2016-11-01 — End: 2016-11-01
  Administered 2016-11-01: 2 mL

## 2016-11-01 NOTE — Progress Notes (Deleted)
Patient states he did well with last injection but is still hurting. Had more than 50 % relief. Pain is on the left side and down the left leg to foot.

## 2016-11-01 NOTE — Patient Instructions (Signed)

## 2016-11-02 NOTE — Procedures (Signed)
Zachary Hester is a 56 year old gentleman with continued left hip and leg pain down to the foot and more of an L5 and S1 distribution. He received 50% relief with L4 transforaminal injection to the left for what is basically multifactorial severe stenosis at this level. He also has findings at L5-S1 more with listhesis. We'll repeat the L4 transforaminal injection to see if we can get some more relief for him. If he does well we can continue to watch him along with Dr. Roda Shutters. If the symptoms return or do not get back to his satisfaction he probably would do well with lumbar decompression.  Lumbosacral Transforaminal Epidural Steroid Injection - Sub-Pedicular Approach with Fluoroscopic Guidance  Patient: Zachary Hester      Date of Birth: [birth year] MRN: [id number] PCP: Lizbeth Bark, FNP      Visit Date: 11/01/2016   Universal Protocol:    Date/Time: 11/01/2016  Consent Given By: the patient  Position: PRONE  Additional Comments: Vital signs were monitored before and after the procedure. Patient was prepped and draped in the usual sterile fashion. The correct patient, procedure, and site was verified.   Injection Procedure Details:  Procedure Site One Meds Administered:  Meds ordered this encounter  Medications  . lidocaine (PF) (XYLOCAINE) 1 % injection 2 mL  . methylPREDNISolone acetate (DEPO-MEDROL) injection 80 mg    Laterality: Left  Location/Site:  L4-L5  Needle size: 22 G  Needle type: Spinal  Needle Placement: Transforaminal  Findings:  -Contrast Used: 1 mL iohexol 180 mg iodine/mL   -Comments: Excellent flow of contrast along the nerve and into the epidural space.  Procedure Details: After squaring off the end-plates to get a true AP view, the C-arm was positioned so that an oblique view of the foramen as noted above was visualized. The target area is just inferior to the "nose of the scotty dog" or sub pedicular. The soft tissues overlying this structure  were infiltrated with 2-3 ml. of 1% Lidocaine without Epinephrine.  The spinal needle was inserted toward the target using a "trajectory" view along the fluoroscope beam.  Under AP and lateral visualization, the needle was advanced so it did not puncture dura and was located close the 6 O'Clock position of the pedical in AP tracterory. Biplanar projections were used to confirm position. Aspiration was confirmed to be negative for CSF and/or blood. A 1-2 ml. volume of Isovue-250 was injected and flow of contrast was noted at each level. Radiographs were obtained for documentation purposes.   After attaining the desired flow of contrast documented above, a 0.5 to 1.0 ml test dose of 0.25% Marcaine was injected into each respective transforaminal space.  The patient was observed for 90 seconds post injection.  After no sensory deficits were reported, and normal lower extremity motor function was noted,   the above injectate was administered so that equal amounts of the injectate were placed at each foramen (level) into the transforaminal epidural space.   Additional Comments:  The patient tolerated the procedure well Dressing: Band-Aid    Post-procedure details: Patient was observed during the procedure. Post-procedure instructions were reviewed.  Patient left the clinic in stable condition.

## 2016-11-15 ENCOUNTER — Other Ambulatory Visit: Payer: Self-pay | Admitting: Family Medicine

## 2016-11-15 ENCOUNTER — Other Ambulatory Visit: Payer: Self-pay | Admitting: Internal Medicine

## 2016-11-15 DIAGNOSIS — M5442 Lumbago with sciatica, left side: Principal | ICD-10-CM

## 2016-11-15 DIAGNOSIS — G8929 Other chronic pain: Secondary | ICD-10-CM

## 2016-11-17 ENCOUNTER — Other Ambulatory Visit: Payer: Self-pay | Admitting: Family Medicine

## 2016-11-17 DIAGNOSIS — G8929 Other chronic pain: Secondary | ICD-10-CM

## 2016-11-17 DIAGNOSIS — M5442 Lumbago with sciatica, left side: Principal | ICD-10-CM

## 2016-11-19 ENCOUNTER — Other Ambulatory Visit: Payer: Self-pay | Admitting: Family Medicine

## 2016-12-17 ENCOUNTER — Encounter: Payer: Self-pay | Admitting: Family Medicine

## 2016-12-17 ENCOUNTER — Telehealth: Payer: Self-pay | Admitting: Family Medicine

## 2016-12-17 DIAGNOSIS — M5442 Lumbago with sciatica, left side: Secondary | ICD-10-CM

## 2016-12-17 DIAGNOSIS — G8929 Other chronic pain: Secondary | ICD-10-CM

## 2016-12-17 MED ORDER — TRAMADOL HCL 50 MG PO TABS: ORAL_TABLET | ORAL | 0 refills | Status: DC

## 2016-12-17 NOTE — Telephone Encounter (Signed)
Script available for pick up.  

## 2016-12-17 NOTE — Telephone Encounter (Signed)
Patient called to request a refill on  traMADol (ULTRAM) 50 MG tablet Please follow up

## 2016-12-20 ENCOUNTER — Other Ambulatory Visit: Payer: Self-pay | Admitting: Family Medicine

## 2016-12-20 DIAGNOSIS — M5442 Lumbago with sciatica, left side: Principal | ICD-10-CM

## 2016-12-20 DIAGNOSIS — G8929 Other chronic pain: Secondary | ICD-10-CM

## 2016-12-20 MED ORDER — TRAMADOL HCL 50 MG PO TABS: ORAL_TABLET | ORAL | 0 refills | Status: DC

## 2016-12-20 NOTE — Telephone Encounter (Signed)
Patient came in to pick up his RX

## 2016-12-28 NOTE — Telephone Encounter (Signed)
This encounter was created in error - please disregard.

## 2017-01-10 ENCOUNTER — Ambulatory Visit: Payer: Self-pay | Attending: Family Medicine | Admitting: Family Medicine

## 2017-01-10 ENCOUNTER — Encounter: Payer: Self-pay | Admitting: Family Medicine

## 2017-01-10 VITALS — BP 99/64 | HR 68 | Temp 98.8°F | Resp 18 | Ht 73.0 in | Wt 184.2 lb

## 2017-01-10 DIAGNOSIS — Z131 Encounter for screening for diabetes mellitus: Secondary | ICD-10-CM | POA: Insufficient documentation

## 2017-01-10 DIAGNOSIS — Z79899 Other long term (current) drug therapy: Secondary | ICD-10-CM | POA: Insufficient documentation

## 2017-01-10 DIAGNOSIS — M5442 Lumbago with sciatica, left side: Secondary | ICD-10-CM | POA: Insufficient documentation

## 2017-01-10 DIAGNOSIS — R42 Dizziness and giddiness: Secondary | ICD-10-CM | POA: Insufficient documentation

## 2017-01-10 DIAGNOSIS — G8929 Other chronic pain: Secondary | ICD-10-CM | POA: Insufficient documentation

## 2017-01-10 DIAGNOSIS — Z23 Encounter for immunization: Secondary | ICD-10-CM | POA: Insufficient documentation

## 2017-01-10 MED ORDER — IBUPROFEN 600 MG PO TABS: 600.0000 mg | ORAL_TABLET | Freq: Three times a day (TID) | ORAL | 1 refills | Status: DC | PRN

## 2017-01-10 MED ORDER — TRAMADOL HCL 50 MG PO TABS: ORAL_TABLET | ORAL | 0 refills | Status: DC

## 2017-01-10 NOTE — Progress Notes (Signed)
Subjective:  Patient ID: Zachary Hester, Zachary Hester    DOB: 01/26/1961  Age: 56 y.o. MRN: [id number]  CC: Medication Refill   HPI Zachary Hester presents for medication refill. History of  chronic low back pain. Denies any significant history of injury. The patient first noted symptoms 10 years ago. 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 , and is located at the bilateral lower back. The pain is described as aching and occurs all day. The symptoms denies been progressive. Symptoms are exacerbated by extension and flexion. Factors which relieve the pain include muscle relaxants, narcotic pain medications and rest. Other associated symptoms include tingling in the left leg. Previous history of symptoms: the problem is long-standing. Treatment efforts have included rest, OTC NSAID'S, prescription NSAID'S, muscle relaxers and narcotic pain medication, with and without relief. Recent lumbar x-ray showed DDD with disc narrowing of the L5-S1 vertebrae.  He is requesting refill of Tramadol medication. He reports following up with orthopedic specialist. Dizziness: Onset 1 week ago. He reports dizziness upon waking with standing in the am. He denies any use of antihypertensives or diuretics, ear problems, cardiac problems, loud snoring or periods of apnea. He reports tramadol use throughout the day as needed. He denies family history of dm.   Outpatient Medications Prior to Visit  Medication Sig Dispense Refill  . atorvastatin (LIPITOR) 20 MG tablet TAKE 1 TABLET BY MOUTH DAILY. 30 tablet 2  . colloidal oatmeal PACK Apply 1 each topically daily. Daily for 14 days. 14 packet 0  . diphenhydrAMINE (BENADRYL) 25 mg capsule Take 1 capsule (25 mg total) by mouth every 6 (six) hours as needed for itching. 30 capsule 0  . Ergocalciferol (VITAMIN D2 PO) Take 1 tablet by mouth daily.    . predniSONE (DELTASONE) 10 MG  tablet DAY 1: TAKE 6 TABLETS BY MOUTH WITH MEAL; DAY 2: TAKE 5 TABLETS; DAY 3 TAKE 4 TABLETS; DAY 4 TAKE 3 TABLETS; DAY 5 TAKE 2 TABLETS; DAY 6 TAKE 1 TABLET. 21 tablet 0  . ranitidine (ZANTAC) 150 MG tablet Take 1 tablet (150 mg total) by mouth 2 (two) times daily as needed for heartburn. 30 tablet 0  . triamcinolone cream (KENALOG) 0.5 % Apply 1 application topically 2 (two) times daily. Apply to affected areas as needed for 14 days. 30 g 0  . ibuprofen (ADVIL,MOTRIN) 600 MG tablet Take 1 tablet (600 mg total) by mouth every 8 (eight) hours as needed for moderate pain or cramping (Take with food.). 30 tablet 1  . traMADol (ULTRAM) 50 MG tablet TAKE 1 TABLET BY MOUTH EVERY 12 HOURS AS NEEDED FOR SEVERE PAIN. 60 tablet 0   No facility-administered medications prior to visit.     ROS Review of Systems  Constitutional: Negative.   HENT: Negative.   Respiratory: Negative.   Cardiovascular: Negative.   Musculoskeletal: Positive for back pain (chronic).  Neurological: Positive for dizziness.  Psychiatric/Behavioral: Negative.     Objective:  BP 99/64 (BP Location: Left Arm, Patient Position: Sitting, Cuff Size: Normal)   Pulse 68   Temp 98.8 F (37.1 C) (Oral)   Resp 18   Ht 6\' 1"  (1.854 m)   Wt 184 lb 3.2 oz (83.6 kg)   HC 18" (45.7 cm)   SpO2 100%   BMI 24.30 kg/m   BP/Weight 01/10/2017 11/01/2016 10/18/2016  Systolic BP 99 137 137  Diastolic BP 64  84 89  Wt. (Lbs) 184.2 - -  BMI 24.3 - -     Physical Exam  Constitutional: He is oriented to person, place, and time. He appears well-developed and well-nourished.  HENT:  Head: Normocephalic and atraumatic.  Right Ear: External ear normal.  Left Ear: External ear normal.  Nose: Nose normal.  Mouth/Throat: Oropharynx is clear and moist.  Moderate amount of soft cerumen to bilateral canals.  Eyes: Conjunctivae and EOM are normal. Pupils are equal, round, and reactive to light.  Neck: Normal range of motion. Neck supple.    Cardiovascular: Normal rate, regular rhythm, normal heart sounds and intact distal pulses.  Pulmonary/Chest: Effort normal and breath sounds normal.  Abdominal: Soft. Bowel sounds are normal.  Musculoskeletal:       Lumbar back: He exhibits pain.  Neurological: He is alert and oriented to person, place, and time.  Skin: Skin is warm and dry.  Psychiatric: He expresses no homicidal and no suicidal ideation. He expresses no suicidal plans and no homicidal plans.  Nursing note and vitals reviewed.  Assessment & Plan:   1. Chronic bilateral low back pain with left-sided sciatica  - traMADol (ULTRAM) 50 MG tablet; TAKE 1 TABLET BY MOUTH EVERY 12 HOURS AS NEEDED FOR SEVERE PAIN.  Dispense: 60 tablet; Refill: 0 - ibuprofen (ADVIL,MOTRIN) 600 MG tablet; Take 1 tablet (600 mg total) by mouth every 8 (eight) hours as needed for moderate pain or cramping (Take with food.).  Dispense: 30 tablet; Refill: 1  2. Postural dizziness Orthostatic vs taken and (-)  3. Diabetes mellitus screening Rule out glucose problem - Hemoglobin A1c  4. Needs flu shot  - Flu Vaccine QUAD 6+ mos PF IM (Fluarix Quad PF)     Follow-up: Return if symptoms worsen or fail to improve.   Lizbeth BarkMandesia R Seng Larch FNP

## 2017-01-10 NOTE — Patient Instructions (Addendum)
Increase water and salt intake.  Rehydration, Adult Rehydration is the replacement of body fluids and salts and minerals (electrolytes) that are lost during dehydration. Dehydration is when there is not enough fluid or water in the body. This happens when you lose more fluids than you take in. Common causes of dehydration include:  Vomiting.  Diarrhea.  Excessive sweating, such as from heat exposure or exercise.  Taking medicines that cause the body to lose excess fluid (diuretics).  Impaired kidney function.  Not drinking enough fluid.  Certain illnesses or infections.  Certain poorly controlled long-term (chronic) illnesses, such as diabetes, heart disease, and kidney disease.  Symptoms of mild dehydration may include thirst, dry lips and mouth, dry skin, and dizziness. Symptoms of severe dehydration may include increased heart rate, confusion, fainting, and not urinating. You can rehydrate by drinking certain fluids or getting fluids through an IV tube, as told by your health care provider. What are the risks? Generally, rehydration is safe. However, one problem that can happen is taking in too much fluid (overhydration). This is rare. If overhydration happens, it can cause an electrolyte imbalance, kidney failure, or a decrease in salt (sodium) levels in the body. How to rehydrate Follow instructions from your health care provider for rehydration. The kind of fluid you should drink and the amount you should drink depend on your condition.  If directed by your health care provider, drink an oral rehydration solution (ORS). This is a drink designed to treat dehydration that is found in pharmacies and retail stores. ? Make an ORS by following instructions on the package. ? Start by drinking small amounts, about  cup (120 mL) every 5-10 minutes. ? Slowly increase how much you drink until you have taken the amount recommended by your health care provider.  Drink enough clear fluids  to keep your urine clear or pale yellow. If you were instructed to drink an ORS, finish the ORS first, then start slowly drinking other clear fluids. Drink fluids such as: ? Water. Do not drink only water. Doing that can lead to having too little sodium in your body (hyponatremia). ? Ice chips. ? Fruit juice that you have added water to (diluted juice). ? Low-calorie sports drinks.  If you are severely dehydrated, your health care provider may recommend that you receive fluids through an IV tube in the hospital.  Do not take sodium tablets. Doing that can lead to the condition of having too much sodium in your body (hypernatremia).  Eating while you rehydrate Follow instructions from your health care provider about what to eat while you rehydrate. Your health care provider may recommend that you slowly begin eating regular foods in small amounts.  Eat foods that contain a healthy balance of electrolytes, such as bananas, oranges, potatoes, tomatoes, and spinach.  Avoid foods that are greasy or contain a lot of fat or sugar.  In some cases, you may get nutrition through a feeding tube that is passed through your nose and into your stomach (nasogastric tube, or NG tube). This may be done if you have uncontrolled vomiting or diarrhea. Beverages to avoid Certain beverages may make dehydration worse. While you rehydrate, avoid:  Alcohol.  Caffeine.  Drinks that contain a lot of sugar. These include: ? High-calorie sports drinks. ? Fruit juice that is not diluted. ? Soda.  Check nutrition labels to see how much sugar or caffeine a beverage contains. Signs of dehydration recovery You may be recovering from dehydration if:  You are  urinating more often than before you started rehydrating.  Your urine is clear or pale yellow.  Your energy level improves.  You vomit less frequently.  You have diarrhea less frequently.  Your appetite improves or returns to normal.  You feel less  dizzy or less light-headed.  Your skin tone and color start to look more normal.  Contact a health care provider if:  You continue to have symptoms of mild dehydration, such as: ? Thirst. ? Dry lips. ? Slightly dry mouth. ? Dry, warm skin. ? Dizziness.  You continue to vomit or have diarrhea. Get help right away if:  You have symptoms of dehydration that get worse.  You feel: ? Confused. ? Weak. ? Like you are going to faint.  You have not urinated in 6-8 hours.  You have very dark urine.  You have trouble breathing.  Your heart rate while sitting still is over 100 beats a minute.  You cannot drink fluids without vomiting.  You have vomiting or diarrhea that: ? Gets worse. ? Does not go away.  You have a fever. This information is not intended to replace advice given to you by your health care provider. Make sure you discuss any questions you have with your health care provider. Document Released: 04/19/2011 Document Revised: 08/15/2015 Document Reviewed: 03/21/2015 Elsevier Interactive Patient Education  2018 [employer]   Chronic Back Pain When back pain lasts longer than 3 months, it is called chronic back pain.The cause of your back pain may not be known. Some common causes include:  Wear and tear (degenerative disease) of the bones, ligaments, or disks in your back.  Inflammation and stiffness in your back (arthritis).  People who have chronic back pain often go through certain periods in which the pain is more intense (flare-ups). Many people can learn to manage the pain with home care. Follow these instructions at home: Pay attention to any changes in your symptoms. Take these actions to help with your pain: Activity  Avoid bending and activities that make the problem worse.  Do not sit or stand in one place for long periods of time.  Take brief periods of rest throughout the day. This will reduce your pain. Resting in a lying or standing position  is usually better than sitting to rest.  When you are resting for longer periods, mix in some mild activity or stretching between periods of rest. This will help to prevent stiffness and pain.  Get regular exercise. Ask your health care provider what activities are safe for you.  Do not lift anything that is heavier than 10 lb (4.5 kg). Always use proper lifting technique, which includes: ? Bending your knees. ? Keeping the load close to your body. ? Avoiding twisting. Managing pain  If directed, apply ice to the painful area. Your health care provider may recommend applying ice during the first 24-48 hours after a flare-up begins. ? Put ice in a plastic bag. ? Place a towel between your skin and the bag. ? Leave the ice on for 20 minutes, 2-3 times per day.  After icing, apply heat to the affected area as often as told by your health care provider. Use the heat source that your health care provider recommends, such as a moist heat pack or a heating pad. ? Place a towel between your skin and the heat source. ? Leave the heat on for 20-30 minutes. ? Remove the heat if your skin turns bright red. This is especially important  if you are unable to feel pain, heat, or cold. You may have a greater risk of getting burned.  Try soaking in a warm tub.  Take over-the-counter and prescription medicines only as told by your health care provider.  Keep all follow-up visits as told by your health care provider. This is important. Contact a health care provider if:  You have pain that is not relieved with rest or medicine. Get help right away if:  You have weakness or numbness in one or both of your legs or feet.  You have trouble controlling your bladder or your bowels.  You have nausea or vomiting.  You have pain in your abdomen.  You have shortness of breath or you faint. This information is not intended to replace advice given to you by your health care provider. Make sure you discuss  any questions you have with your health care provider. Document Released: 03/04/2004 Document Revised: 06/05/2015 Document Reviewed: 07/15/2014 Elsevier Interactive Patient Education  2018 [employer].

## 2017-01-10 NOTE — Progress Notes (Signed)
Patient is here for med refill   Patient complains heady shaky when gets up from bed

## 2017-02-17 ENCOUNTER — Other Ambulatory Visit: Payer: Self-pay | Admitting: Internal Medicine

## 2017-02-17 DIAGNOSIS — G8929 Other chronic pain: Secondary | ICD-10-CM

## 2017-02-17 DIAGNOSIS — M5442 Lumbago with sciatica, left side: Principal | ICD-10-CM

## 2017-02-22 ENCOUNTER — Other Ambulatory Visit: Payer: Self-pay | Admitting: Internal Medicine

## 2017-02-22 DIAGNOSIS — G8929 Other chronic pain: Secondary | ICD-10-CM

## 2017-02-22 DIAGNOSIS — M5442 Lumbago with sciatica, left side: Principal | ICD-10-CM

## 2017-02-24 NOTE — Telephone Encounter (Signed)
Refill request

## 2017-03-02 ENCOUNTER — Ambulatory Visit: Payer: Self-pay | Attending: Family Medicine

## 2017-03-18 ENCOUNTER — Telehealth: Payer: Self-pay | Admitting: Family Medicine

## 2017-03-18 NOTE — Telephone Encounter (Signed)
Patient called and requested for listed medications to be refilled.  traMADol (ULTRAM) 50 MG tablet [[id number]][[id number]]  [facility].

## 2017-03-21 ENCOUNTER — Other Ambulatory Visit: Payer: Self-pay | Admitting: Family Medicine

## 2017-03-21 NOTE — Telephone Encounter (Signed)
LVM to return call.

## 2017-03-21 NOTE — Telephone Encounter (Signed)
Too soon to request refill for Tramadol. If he is experiencing pain ibuprofen or naprosyn can be prescribed.

## 2017-03-23 ENCOUNTER — Other Ambulatory Visit: Payer: Self-pay | Admitting: Family Medicine

## 2017-03-23 DIAGNOSIS — G8929 Other chronic pain: Secondary | ICD-10-CM

## 2017-03-23 DIAGNOSIS — M5442 Lumbago with sciatica, left side: Principal | ICD-10-CM

## 2017-04-15 ENCOUNTER — Telehealth: Payer: Self-pay | Admitting: Family Medicine

## 2017-04-15 DIAGNOSIS — G8929 Other chronic pain: Secondary | ICD-10-CM

## 2017-04-15 DIAGNOSIS — M5442 Lumbago with sciatica, left side: Principal | ICD-10-CM

## 2017-04-15 NOTE — Telephone Encounter (Signed)
Pt called to request a refill for traMADol (ULTRAM) 50 MG tablet Please sent it [facility] [place] Please follow up

## 2017-04-18 MED ORDER — TRAMADOL HCL 50 MG PO TABS: 50.0000 mg | ORAL_TABLET | Freq: Two times a day (BID) | ORAL | 0 refills | Status: DC | PRN

## 2017-04-18 NOTE — Telephone Encounter (Signed)
Refilled

## 2017-05-19 ENCOUNTER — Other Ambulatory Visit: Payer: Self-pay | Admitting: Family Medicine

## 2017-05-19 DIAGNOSIS — M5442 Lumbago with sciatica, left side: Principal | ICD-10-CM

## 2017-05-19 DIAGNOSIS — G8929 Other chronic pain: Secondary | ICD-10-CM

## 2017-05-20 ENCOUNTER — Other Ambulatory Visit: Payer: Self-pay

## 2017-05-20 DIAGNOSIS — M5442 Lumbago with sciatica, left side: Principal | ICD-10-CM

## 2017-05-20 DIAGNOSIS — G8929 Other chronic pain: Secondary | ICD-10-CM

## 2017-05-20 NOTE — Telephone Encounter (Signed)
Pt contacted the office requesting traMADol (ULTRAM) 50 MG tablet to be [facility] to [facility]. Informed pt of the 48-72 hours policy for medication refill. Pt states he contacted the pharmacy 2 days ago for the refill. I Informed pt that I will send a message to the provider.

## 2017-05-23 ENCOUNTER — Telehealth (INDEPENDENT_AMBULATORY_CARE_PROVIDER_SITE_OTHER): Payer: Self-pay | Admitting: Family Medicine

## 2017-05-23 MED ORDER — TRAMADOL HCL 50 MG PO TABS: 50.0000 mg | ORAL_TABLET | Freq: Two times a day (BID) | ORAL | 0 refills | Status: DC | PRN

## 2017-05-23 NOTE — Addendum Note (Signed)
Addended by: Hoy RegisterNEWLIN, Burns Timson on: 05/23/2017 01:35 PM   Modules accepted: Orders

## 2017-05-23 NOTE — Telephone Encounter (Signed)
Patient called and requested for a refill on listed medication. [facility].  traMADol (ULTRAM) 50 MG tablet

## 2017-05-23 NOTE — Telephone Encounter (Signed)
Zachary Hester could you schedule pt an re-establish appointment   Dr. Alvis LemmingsNewlin has sent a limited supply of tramadol. He will not be able to get another refill toll he see's an provider for re-establish

## 2017-05-23 NOTE — Telephone Encounter (Signed)
He needs an office visit to establish care.  I have sent a limited supply to his pharmacy.

## 2017-06-08 ENCOUNTER — Telehealth: Payer: Self-pay | Admitting: Family Medicine

## 2017-06-08 DIAGNOSIS — G8929 Other chronic pain: Secondary | ICD-10-CM

## 2017-06-08 DIAGNOSIS — M5442 Lumbago with sciatica, left side: Principal | ICD-10-CM

## 2017-06-08 NOTE — Telephone Encounter (Signed)
Pt called to request a medication refill -traMADol (ULTRAM) 50 MG tablet  To [facility] pharmacy Please follow up

## 2017-06-09 NOTE — Telephone Encounter (Signed)
Refilled on 05/23/2017

## 2017-06-10 NOTE — Telephone Encounter (Signed)
Patient called again and said that he needs his tramadol because it was only for 15 days. Please fu with patient.

## 2017-06-10 NOTE — Telephone Encounter (Signed)
Pt called to check on the statu of the request for med refill, I explain him that the pcp is with the Pt. sio that he need to wait until the pcp answer the request

## 2017-06-13 MED ORDER — TRAMADOL HCL 50 MG PO TABS: 50.0000 mg | ORAL_TABLET | Freq: Two times a day (BID) | ORAL | 0 refills | Status: DC | PRN

## 2017-06-13 NOTE — Telephone Encounter (Signed)
Refilled.  He needs an office visit 

## 2017-06-14 ENCOUNTER — Other Ambulatory Visit: Payer: Self-pay | Admitting: Family Medicine

## 2017-06-14 DIAGNOSIS — G8929 Other chronic pain: Secondary | ICD-10-CM

## 2017-06-14 DIAGNOSIS — M5442 Lumbago with sciatica, left side: Principal | ICD-10-CM

## 2017-06-27 ENCOUNTER — Ambulatory Visit: Payer: Medicaid Other | Admitting: Family Medicine

## 2017-06-27 ENCOUNTER — Encounter: Payer: Self-pay | Admitting: Family Medicine

## 2017-06-27 ENCOUNTER — Ambulatory Visit: Payer: Medicaid Other | Admitting: Internal Medicine

## 2017-06-27 ENCOUNTER — Ambulatory Visit: Payer: Self-pay | Attending: Family Medicine | Admitting: Family Medicine

## 2017-06-27 VITALS — BP 109/69 | HR 72 | Temp 97.3°F | Ht 73.0 in | Wt 179.8 lb

## 2017-06-27 DIAGNOSIS — M5442 Lumbago with sciatica, left side: Secondary | ICD-10-CM | POA: Insufficient documentation

## 2017-06-27 DIAGNOSIS — E7800 Pure hypercholesterolemia, unspecified: Secondary | ICD-10-CM | POA: Insufficient documentation

## 2017-06-27 DIAGNOSIS — G8929 Other chronic pain: Secondary | ICD-10-CM | POA: Insufficient documentation

## 2017-06-27 DIAGNOSIS — M5136 Other intervertebral disc degeneration, lumbar region: Secondary | ICD-10-CM | POA: Insufficient documentation

## 2017-06-27 DIAGNOSIS — M48061 Spinal stenosis, lumbar region without neurogenic claudication: Secondary | ICD-10-CM | POA: Insufficient documentation

## 2017-06-27 DIAGNOSIS — I10 Essential (primary) hypertension: Secondary | ICD-10-CM | POA: Insufficient documentation

## 2017-06-27 DIAGNOSIS — Z79899 Other long term (current) drug therapy: Secondary | ICD-10-CM | POA: Insufficient documentation

## 2017-06-27 MED ORDER — IBUPROFEN 600 MG PO TABS: 600.0000 mg | ORAL_TABLET | Freq: Three times a day (TID) | ORAL | 1 refills | Status: DC | PRN

## 2017-06-27 MED ORDER — TRAMADOL HCL 50 MG PO TABS: 50.0000 mg | ORAL_TABLET | Freq: Two times a day (BID) | ORAL | 2 refills | Status: DC | PRN

## 2017-06-27 MED ORDER — TRAMADOL HCL 50 MG PO TABS: 50.0000 mg | ORAL_TABLET | Freq: Two times a day (BID) | ORAL | 0 refills | Status: DC | PRN

## 2017-06-27 NOTE — Progress Notes (Signed)
Subjective:  Patient ID: Zachary Hester, male    DOB: 1960-04-28  Age: 57 y.o. MRN: [id number]  CC: Medication Refill   HPI Derick A Attia is a 57 year old male with a history of low back pain from spinal stenosis who presents today to establish care with me. He is status post epidural spinal injections by orthopedics with minimal improvement in symptoms.  Pain is in his left lower back and left buttock cheek and radiates down his left lower extremity with intermittent left leg numbness.  Pain is uncontrolled on NSAIDs but has been controlled on tramadol. He denies loss of sphincteric function.   MRI lumbar spine without contrast 08/2016: IMPRESSION: 1. 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 2. Severe spinal stenosis and mild-to-moderate neural foraminal stenosis at L4-5. 3. Mild-to-moderate lateral recess and neural foraminal stenosis at L5-S1.   Past Medical History:  Diagnosis Date  . Back pain   . Chronic back pain   . HTN (hypertension)   . Hypercholesteremia     Past Surgical History:  Procedure Laterality Date  . NO PAST SURGERIES      No Known Allergies   Outpatient Medications Prior to Visit  Medication Sig Dispense Refill  . traMADol (ULTRAM) 50 MG tablet Take 1 tablet (50 mg total) by mouth every 12 (twelve) hours as needed. 30 tablet 0  . atorvastatin (LIPITOR) 20 MG tablet TAKE 1 TABLET BY MOUTH DAILY. (Patient not taking: Reported on 06/27/2017) 30 tablet 2  . colloidal oatmeal PACK Apply 1 each topically daily. Daily for 14 days. (Patient not taking: Reported on 06/27/2017) 14 packet 0  . diphenhydrAMINE (BENADRYL) 25 mg capsule Take 1 capsule (25 mg total) by mouth every 6 (six) hours as needed for itching. (Patient not taking: Reported on 06/27/2017) 30 capsule 0  . Ergocalciferol (VITAMIN D2 PO) Take 1 tablet by mouth daily.    . predniSONE (DELTASONE) 10 MG tablet  DAY 1: TAKE 6 TABLETS BY MOUTH WITH MEAL; DAY 2: TAKE 5 TABLETS; DAY 3 TAKE 4 TABLETS; DAY 4 TAKE 3 TABLETS; DAY 5 TAKE 2 TABLETS; DAY 6 TAKE 1 TABLET. (Patient not taking: Reported on 06/27/2017) 21 tablet 0  . ranitidine (ZANTAC) 150 MG tablet Take 1 tablet (150 mg total) by mouth 2 (two) times daily as needed for heartburn. (Patient not taking: Reported on 06/27/2017) 30 tablet 0  . triamcinolone cream (KENALOG) 0.5 % Apply 1 application topically 2 (two) times daily. Apply to affected areas as needed for 14 days. (Patient not taking: Reported on 06/27/2017) 30 g 0  . ibuprofen (ADVIL,MOTRIN) 600 MG tablet Take 1 tablet (600 mg total) by mouth every 8 (eight) hours as needed for moderate pain or cramping (Take with food.). (Patient not taking: Reported on 06/27/2017) 30 tablet 1   No facility-administered medications prior to visit.     ROS Review of Systems  Constitutional: Negative for activity change and appetite change.  HENT: Negative for sinus pressure and sore throat.   Eyes: Negative for visual disturbance.  Respiratory: Negative for cough, chest tightness and shortness of breath.   Cardiovascular: Negative for chest pain and leg swelling.  Gastrointestinal: Negative for abdominal distention, abdominal pain, constipation and diarrhea.  Endocrine: Negative.   Genitourinary: Negative for dysuria.  Musculoskeletal: Positive for back pain. Negative for joint swelling and myalgias.  Skin: Negative for rash.  Allergic/Immunologic: Negative.   Neurological: Negative for weakness, light-headedness and numbness.  Psychiatric/Behavioral:  Negative for dysphoric mood and suicidal ideas.    Objective:  BP 109/69   Pulse 72   Temp (!) 97.3 F (36.3 C) (Oral)   Ht  (1.854 m)   Wt 179 lb 12.8 oz (81.6 kg)   SpO2 100%   BMI 23.72 kg/m   BP/Weight 06/27/2017 01/10/2017 11/01/2016  Systolic BP 109 99 137  Diastolic BP 69 64 84  Wt. (Lbs) 179.8 184.2 -  BMI 23.72 24.3 -       Physical Exam  Constitutional: He is oriented to person, place, and time. He appears well-developed and well-nourished.  Cardiovascular: Normal rate, normal heart sounds and intact distal pulses.  No murmur heard. Pulmonary/Chest: Effort normal and breath sounds normal. He has no wheezes. He has no rales. He exhibits no tenderness.  Abdominal: Soft. Bowel sounds are normal. He exhibits no distension and no mass. There is no tenderness.  Musculoskeletal: Normal range of motion. He exhibits tenderness (TTP of left paraspinal region and left gluteus muscle; negative straight leg rasie b/l).  Neurological: He is alert and oriented to person, place, and time.  Skin: Skin is warm and dry.  Psychiatric: He has a normal mood and affect.     Assessment & Plan:   1. Spinal stenosis at L4-L5 level S/p post epidural spinal injection with no much relief Pain is controlled on tramadol He has signed a controlled substances agreement today. - Drug Screen 12+Alcohol+CRT, Ur - traMADol (ULTRAM) 50 MG tablet; Take 1 tablet (50 mg total) by mouth every 12 (twelve) hours as needed.  Dispense: 60 tablet; Refill: 2  2. Chronic bilateral low back pain with left-sided sciatica - Drug Screen 12+Alcohol+CRT, Ur - traMADol (ULTRAM) 50 MG tablet; Take 1 tablet (50 mg total) by mouth every 12 (twelve) hours as needed.  Dispense: 60 tablet; Refill: 2 - ibuprofen (ADVIL,MOTRIN) 600 MG tablet; Take 1 tablet (600 mg total) by mouth every 8 (eight) hours as needed for moderate pain or cramping (Take with food.).  Dispense: 30 tablet; Refill: 1   Meds ordered this encounter  Medications  . DISCONTD: traMADol (ULTRAM) 50 MG tablet    Sig: Take 1 tablet (50 mg total) by mouth every 12 (twelve) hours as needed.    Dispense:  60 tablet    Refill:  0  . traMADol (ULTRAM) 50 MG tablet    Sig: Take 1 tablet (50 mg total) by mouth every 12 (twelve) hours as needed.    Dispense:  60 tablet    Refill:  2  .  ibuprofen (ADVIL,MOTRIN) 600 MG tablet    Sig: Take 1 tablet (600 mg total) by mouth every 8 (eight) hours as needed for moderate pain or cramping (Take with food.).    Dispense:  30 tablet    Refill:  1    Follow-up: Return in about 3 months (around 09/27/2017) for follow up of low back pain.   Hoy Register MD

## 2017-07-14 LAB — DRUG SCREEN 12+ALCOHOL+CRT, UR
Amphetamines, Urine: NEGATIVE ng/mL
BENZODIAZ UR QL: NEGATIVE ng/mL
Barbiturate: NEGATIVE ng/mL
CANNABINOIDS: NEGATIVE ng/mL
COCAINE (METABOLITE): NEGATIVE ng/mL
Creatinine, Urine: 200.1 mg/dL (ref 20.0–300.0)
ETHANOL, URINE: NEGATIVE %
MEPERIDINE: NEGATIVE ng/mL
Methadone: NEGATIVE ng/mL
OPIATE SCREEN URINE: NEGATIVE ng/mL
Oxycodone/Oxymorphone, Urine: NEGATIVE ng/mL
PHENCYCLIDINE: NEGATIVE ng/mL
PROPOXYPHENE: NEGATIVE ng/mL
Tramadol: POSITIVE — AB

## 2017-08-08 ENCOUNTER — Ambulatory Visit (INDEPENDENT_AMBULATORY_CARE_PROVIDER_SITE_OTHER): Payer: BLUE CROSS/BLUE SHIELD | Admitting: Emergency Medicine

## 2017-08-08 ENCOUNTER — Other Ambulatory Visit: Payer: Self-pay

## 2017-08-08 ENCOUNTER — Encounter: Payer: Self-pay | Admitting: Emergency Medicine

## 2017-08-08 VITALS — BP 107/72 | HR 71 | Temp 97.8°F | Resp 16 | Ht 72.25 in | Wt 177.0 lb

## 2017-08-08 DIAGNOSIS — Z0000 Encounter for general adult medical examination without abnormal findings: Secondary | ICD-10-CM

## 2017-08-08 DIAGNOSIS — R399 Unspecified symptoms and signs involving the genitourinary system: Secondary | ICD-10-CM

## 2017-08-08 LAB — POCT URINALYSIS DIP (MANUAL ENTRY)
Bilirubin, UA: NEGATIVE
Blood, UA: NEGATIVE
GLUCOSE UA: NEGATIVE mg/dL
Leukocytes, UA: NEGATIVE
NITRITE UA: NEGATIVE
Protein Ur, POC: NEGATIVE mg/dL
Spec Grav, UA: 1.02 (ref 1.010–1.025)
Urobilinogen, UA: 0.2 E.U./dL
pH, UA: 7 (ref 5.0–8.0)

## 2017-08-08 NOTE — Patient Instructions (Addendum)
   IF you received an x-ray today, you will receive an invoice from [facility]. Please contact [facility] at [phone] with questions or concerns regarding your invoice.   IF you received labwork today, you will receive an invoice from [facility]. Please contact [facility] at [phone] with questions or concerns regarding your invoice.   Our billing staff will not be able to assist you with questions regarding bills from these companies.  You will be contacted with the lab results as soon as they are available. The fastest way to get your results is to activate your My Chart account. Instructions are located on the last page of this paperwork. If you have not heard from us regarding the results in 2 weeks, please contact this office.      Health Maintenance, Male A healthy lifestyle and preventive care is important for your health and wellness. Ask your health care provider about what schedule of regular examinations is right for you. What should I know about weight and diet? Eat a Healthy Diet  Eat plenty of vegetables, fruits, whole grains, low-fat dairy products, and lean protein.  Do not eat a lot of foods high in solid fats, added sugars, or salt.  Maintain a Healthy Weight Regular exercise can help you achieve or maintain a healthy weight. You should:  Do at least 150 minutes of exercise each week. The exercise should increase your heart rate and make you sweat (moderate-intensity exercise).  Do strength-training exercises at least twice a week.  Watch Your Levels of Cholesterol and Blood Lipids  Have your blood tested for lipids and cholesterol every 5 years starting at 57 years of age. If you are at high risk for heart disease, you should start having your blood tested when you are 57 years old. You may need to have your cholesterol levels checked more often if: ? Your lipid or cholesterol levels are high. ? You are older than 57 years of age. ? You  are at high risk for heart disease.  What should I know about cancer screening? Many types of cancers can be detected early and may often be prevented. Lung Cancer  You should be screened every year for lung cancer if: ? You are a current smoker who has smoked for at least 30 years. ? You are a former smoker who has quit within the past 15 years.  Talk to your health care provider about your screening options, when you should start screening, and how often you should be screened.  Colorectal Cancer  Routine colorectal cancer screening usually begins at 57 years of age and should be repeated every 5-10 years until you are 57 years old. You may need to be screened more often if early forms of precancerous polyps or small growths are found. Your health care provider may recommend screening at an earlier age if you have risk factors for colon cancer.  Your health care provider may recommend using home test kits to check for hidden blood in the stool.  A small camera at the end of a tube can be used to examine your colon (sigmoidoscopy or colonoscopy). This checks for the earliest forms of colorectal cancer.  Prostate and Testicular Cancer  Depending on your age and overall health, your health care provider may do certain tests to screen for prostate and testicular cancer.  Talk to your health care provider about any symptoms or concerns you have about testicular or prostate cancer.  Skin Cancer  Check your skin   from head to toe regularly.  Tell your health care provider about any new moles or changes in moles, especially if: ? There is a change in a mole's size, shape, or color. ? You have a mole that is larger than a pencil eraser.  Always use sunscreen. Apply sunscreen liberally and repeat throughout the day.  Protect yourself by wearing long sleeves, pants, a wide-brimmed hat, and sunglasses when outside.  What should I know about heart disease, diabetes, and high blood  pressure?  If you are 18-39 years of age, have your blood pressure checked every 3-5 years. If you are 40 years of age or older, have your blood pressure checked every year. You should have your blood pressure measured twice-once when you are at a hospital or clinic, and once when you are not at a hospital or clinic. Record the average of the two measurements. To check your blood pressure when you are not at a hospital or clinic, you can use: ? An automated blood pressure machine at a pharmacy. ? A home blood pressure monitor.  Talk to your health care provider about your target blood pressure.  If you are between 45-79 years old, ask your health care provider if you should take aspirin to prevent heart disease.  Have regular diabetes screenings by checking your fasting blood sugar level. ? If you are at a normal weight and have a low risk for diabetes, have this test once every three years after the age of 45. ? If you are overweight and have a high risk for diabetes, consider being tested at a younger age or more often.  A one-time screening for abdominal aortic aneurysm (AAA) by ultrasound is recommended for men aged 65-75 years who are current or former smokers. What should I know about preventing infection? Hepatitis B If you have a higher risk for hepatitis B, you should be screened for this virus. Talk with your health care provider to find out if you are at risk for hepatitis B infection. Hepatitis C Blood testing is recommended for:  Everyone born from 1945 through 1965.  Anyone with known risk factors for hepatitis C.  Sexually Transmitted Diseases (STDs)  You should be screened each year for STDs including gonorrhea and chlamydia if: ? You are sexually active and are younger than 57 years of age. ? You are older than 57 years of age and your health care provider tells you that you are at risk for this type of infection. ? Your sexual activity has changed since you were last  screened and you are at an increased risk for chlamydia or gonorrhea. Ask your health care provider if you are at risk.  Talk with your health care provider about whether you are at high risk of being infected with HIV. Your health care provider may recommend a prescription medicine to help prevent HIV infection.  What else can I do?  Schedule regular health, dental, and eye exams.  Stay current with your vaccines (immunizations).  Do not use any tobacco products, such as cigarettes, chewing tobacco, and e-cigarettes. If you need help quitting, ask your health care provider.  Limit alcohol intake to no more than 2 drinks per day. One drink equals 12 ounces of beer, 5 ounces of wine, or 1 ounces of hard liquor.  Do not use street drugs.  Do not share needles.  Ask your health care provider for help if you need support or information about quitting drugs.  Tell your health care   provider if you often feel depressed.  Tell your health care provider if you have ever been abused or do not feel safe at home. This information is not intended to replace advice given to you by your health care provider. Make sure you discuss any questions you have with your health care provider. Document Released: 07/24/2007 Document Revised: 09/24/2015 Document Reviewed: 10/29/2014 Elsevier Interactive Patient Education  2018 [employer]  

## 2017-08-08 NOTE — Progress Notes (Signed)
Zachary Hester 57 y.o.   Chief Complaint  Patient presents with  . Establish Care  . Annual Exam    HISTORY OF PRESENT ILLNESS: This is a 57 y.o. male Here for annual exam; no complaints and no medical concerns.   HPI   Prior to Admission medications   Medication Sig Start Date End Date Taking? Authorizing Provider  traMADol (ULTRAM) 50 MG tablet Take 1 tablet (50 mg total) by mouth every 12 (twelve) hours as needed. 06/27/17  Yes Newlin, Odette Horns, [place]  atorvastatin (LIPITOR) 20 MG tablet TAKE 1 TABLET BY MOUTH DAILY. Patient not taking: Reported on 06/27/2017 09/10/16   Zachary Bark, FNP  colloidal oatmeal PACK Apply 1 each topically daily. Daily for 14 days. Patient not taking: Reported on 06/27/2017 09/30/16   Zachary Bark, FNP  diphenhydrAMINE (BENADRYL) 25 mg capsule Take 1 capsule (25 mg total) by mouth every 6 (six) hours as needed for itching. Patient not taking: Reported on 06/27/2017 09/30/16   Zachary Bark, FNP  Ergocalciferol (VITAMIN D2 PO) Take 1 tablet by mouth daily.    Zachary Hester  ibuprofen (ADVIL,MOTRIN) 600 MG tablet Take 1 tablet (600 mg total) by mouth every 8 (eight) hours as needed for moderate pain or cramping (Take with food.). Patient not taking: Reported on 08/08/2017 06/27/17   Zachary Register, [place]  predniSONE (DELTASONE) 10 MG tablet DAY 1: TAKE 6 TABLETS BY MOUTH WITH MEAL; DAY 2: TAKE 5 TABLETS; DAY 3 TAKE 4 TABLETS; DAY 4 TAKE 3 TABLETS; DAY 5 TAKE 2 TABLETS; DAY 6 TAKE 1 TABLET. Patient not taking: Reported on 06/27/2017 09/30/16   Zachary Bark, FNP  ranitidine (ZANTAC) 150 MG tablet Take 1 tablet (150 mg total) by mouth 2 (two) times daily as needed for heartburn. Patient not taking: Reported on 06/27/2017 06/21/16   Zachary Bark, FNP  triamcinolone cream (KENALOG) 0.5 % Apply 1 application topically 2 (two) times daily. Apply to affected areas as needed for 14 days. 09/30/16   Zachary Bark, FNP    No  Known Allergies  Patient Active Problem List   Diagnosis Date Noted  . Spinal stenosis at L4-L5 level 06/27/2017  . Chronic bilateral low back pain with left-sided sciatica 06/28/2016  . Hypercholesteremia 06/21/2016  . Vitamin D deficiency 06/21/2016  . Heartburn 06/21/2016  . Heme positive stool 06/15/2016  . Generalized abdominal pain 06/15/2016  . Chronic back pain     Past Medical History:  Diagnosis Date  . Back pain   . Chronic back pain   . HTN (hypertension)   . Hypercholesteremia     Past Surgical History:  Procedure Laterality Date  . NO PAST SURGERIES      Social History   Socioeconomic History  . Marital status: Married    Spouse name: Not on file  . Number of children: 6  . Years of education: Not on file  . Highest education level: Not on file  Occupational History  . Occupation: [employer]  Social Needs  . Financial resource strain: Not on file  . Food insecurity:    Worry: Not on file    Inability: Not on file  . Transportation needs:    Medical: Not on file    Non-medical: Not on file  Tobacco Use  . Smoking status: Never Smoker  . Smokeless tobacco: Never Used  Substance and Sexual Activity  . Alcohol use: No  . Drug use: No  . Sexual activity: Not on  file  Lifestyle  . Physical activity:    Days per week: Not on file    Minutes per session: Not on file  . Stress: Not on file  Relationships  . Social connections:    Talks on phone: Not on file    Gets together: Not on file    Attends religious service: Not on file    Active member of club or organization: Not on file    Attends meetings of clubs or organizations: Not on file    Relationship status: Not on file  . Intimate partner violence:    Fear of current or ex partner: Not on file    Emotionally abused: Not on file    Physically abused: Not on file    Forced sexual activity: Not on file  Other Topics Concern  . Not on file  Social History Narrative  . Not on file     Family History  Problem Relation Age of Onset  . Hypertension Mother   . Heart disease Sister   . Diabetes Sister      Review of Systems  Constitutional: Negative.  Negative for chills and fever.  HENT: Negative.  Negative for congestion, ear discharge, hearing loss, nosebleeds and sore throat.   Eyes: Negative.  Negative for blurred vision, double vision, discharge and redness.  Respiratory: Negative.  Negative for cough, hemoptysis and shortness of breath.   Cardiovascular: Negative.  Negative for chest pain and palpitations.  Gastrointestinal: Negative.  Negative for abdominal pain, blood in stool, diarrhea, melena, nausea and vomiting.  Genitourinary: Positive for frequency.       Positive nocturia  Skin: Negative.  Negative for rash.  Neurological: Negative.  Negative for dizziness and headaches.  Endo/Heme/Allergies: Negative.   All other systems reviewed and are negative.   Vitals:   08/08/17 1537  BP: 107/72  Pulse: 71  Resp: 16  Temp: 97.8 F (36.6 C)  SpO2: 99%    Physical Exam  Constitutional: He is oriented to person, place, and time. He appears well-developed and well-nourished.  HENT:  Head: Normocephalic and atraumatic.  Right Ear: External ear normal.  Left Ear: External ear normal.  Mouth/Throat: Oropharynx is clear and moist.  Eyes: Pupils are equal, round, and reactive to light. Conjunctivae are normal.  Neck: Normal range of motion. Neck supple. No JVD present. No thyromegaly present.  Cardiovascular: Normal rate, regular rhythm and normal heart sounds.  Pulmonary/Chest: Effort normal and breath sounds normal.  Abdominal: Soft. Bowel sounds are normal. He exhibits no distension. There is no tenderness.  Musculoskeletal: Normal range of motion. He exhibits no edema or tenderness.  Lymphadenopathy:    He has no cervical adenopathy.  Neurological: He is alert and oriented to person, place, and time. No sensory deficit. He exhibits normal muscle  tone.  Skin: Skin is warm and dry. Capillary refill takes less than 2 seconds. No rash noted.  Psychiatric: He has a normal mood and affect. His behavior is normal.  Vitals reviewed.    ASSESSMENT & PLAN: [name] was seen today for establish care and annual exam.  Diagnoses and all orders for this visit:  Routine general medical examination at a health care facility -     CBC with Differential -     Comprehensive metabolic panel -     Hemoglobin A1c -     Lipid panel -     PSA(Must document that pt has been informed of limitations of PSA testing.)  Lower urinary tract  symptoms (LUTS) -     Ambulatory referral to Urology -     POCT urinalysis dipstick    Patient Instructions       IF you received an x-ray today, you will receive an invoice from [facility]. Please contact [facility] at [phone] with questions or concerns regarding your invoice.   IF you received labwork today, you will receive an invoice from [facility]. Please contact [facility] at [phone] with questions or concerns regarding your invoice.   Our billing staff will not be able to assist you with questions regarding bills from these companies.  You will be contacted with the lab results as soon as they are available. The fastest way to get your results is to activate your My Chart account. Instructions are located on the last page of this paperwork. If you have not heard from [name] regarding the results in 2 weeks, please contact this office.      Health Maintenance, Male A healthy lifestyle and preventive care is important for your health and wellness. Ask your health care provider about what schedule of regular examinations is right for you. What should I know about weight and diet? Eat a Healthy Diet  Eat plenty of vegetables, fruits, whole grains, low-fat dairy products, and lean protein.  Do not eat a lot of foods high in solid fats, added sugars, or salt.  Maintain a  Healthy Weight Regular exercise can help you achieve or maintain a healthy weight. You should:  Do at least 150 minutes of exercise each week. The exercise should increase your heart rate and make you sweat (moderate-intensity exercise).  Do strength-training exercises at least twice a week.  Watch Your Levels of Cholesterol and Blood Lipids  Have your blood tested for lipids and cholesterol every 5 years starting at 57 years of age. If you are at high risk for heart disease, you should start having your blood tested when you are 57 years old. You may need to have your cholesterol levels checked more often if: ? Your lipid or cholesterol levels are high. ? You are older than 57 years of age. ? You are at high risk for heart disease.  What should I know about cancer screening? Many types of cancers can be detected early and may often be prevented. Lung Cancer  You should be screened every year for lung cancer if: ? You are a current smoker who has smoked for at least 30 years. ? You are a former smoker who has quit within the past 15 years.  Talk to your health care provider about your screening options, when you should start screening, and how often you should be screened.  Colorectal Cancer  Routine colorectal cancer screening usually begins at 57 years of age and should be repeated every 5-10 years until you are 57 years old. You may need to be screened more often if early forms of precancerous polyps or small growths are found. Your health care provider may recommend screening at an earlier age if you have risk factors for colon cancer.  Your health care provider may recommend using home test kits to check for hidden blood in the stool.  A small camera at the end of a tube can be used to examine your colon (sigmoidoscopy or colonoscopy). This checks for the earliest forms of colorectal cancer.  Prostate and Testicular Cancer  Depending on your age and overall health, your health  care provider may do certain tests to screen for prostate and  testicular cancer.  Talk to your health care provider about any symptoms or concerns you have about testicular or prostate cancer.  Skin Cancer  Check your skin from head to toe regularly.  Tell your health care provider about any new moles or changes in moles, especially if: ? There is a change in a mole's size, shape, or color. ? You have a mole that is larger than a pencil eraser.  Always use sunscreen. Apply sunscreen liberally and repeat throughout the day.  Protect yourself by wearing long sleeves, pants, a wide-brimmed hat, and sunglasses when outside.  What should I know about heart disease, diabetes, and high blood pressure?  If you are 65-11 years of age, have your blood pressure checked every 3-5 years. If you are 10 years of age or older, have your blood pressure checked every year. You should have your blood pressure measured twice-once when you are at a hospital or clinic, and once when you are not at a hospital or clinic. Record the average of the two measurements. To check your blood pressure when you are not at a hospital or clinic, you can use: ? An automated blood pressure machine at a pharmacy. ? A home blood pressure monitor.  Talk to your health care provider about your target blood pressure.  If you are between 57-60 years old, ask your health care provider if you should take aspirin to prevent heart disease.  Have regular diabetes screenings by checking your fasting blood sugar level. ? If you are at a normal weight and have a low risk for diabetes, have this test once every three years after the age of 34. ? If you are overweight and have a high risk for diabetes, consider being tested at a younger age or more often.  A one-time screening for abdominal aortic aneurysm (AAA) by ultrasound is recommended for men aged 65-75 years who are current or former smokers. What should I know about preventing  infection? Hepatitis B If you have a higher risk for hepatitis B, you should be screened for this virus. Talk with your health care provider to find out if you are at risk for hepatitis B infection. Hepatitis C Blood testing is recommended for:  Everyone born from [birth year] through 1965.  Anyone with known risk factors for hepatitis C.  Sexually Transmitted Diseases (STDs)  You should be screened each year for STDs including gonorrhea and chlamydia if: ? You are sexually active and are younger than 57 years of age. ? You are older than 57 years of age and your health care provider tells you that you are at risk for this type of infection. ? Your sexual activity has changed since you were last screened and you are at an increased risk for chlamydia or gonorrhea. Ask your health care provider if you are at risk.  Talk with your health care provider about whether you are at high risk of being infected with HIV. Your health care provider may recommend a prescription medicine to help prevent HIV infection.  What else can I do?  Schedule regular health, dental, and eye exams.  Stay current with your vaccines (immunizations).  Do not use any tobacco products, such as cigarettes, chewing tobacco, and e-cigarettes. If you need help quitting, ask your health care provider.  Limit alcohol intake to no more than 2 drinks per day. One drink equals 12 ounces of beer, 5 ounces of wine, or 1 ounces of hard liquor.  Do not use street drugs.  Do not share needles.  Ask your health care provider for help if you need support or information about quitting drugs.  Tell your health care provider if you often feel depressed.  Tell your health care provider if you have ever been abused or do not feel safe at home. This information is not intended to replace advice given to you by your health care provider. Make sure you discuss any questions you have with your health care provider. Document Released:  07/24/2007 Document Revised: 09/24/2015 Document Reviewed: 10/29/2014 Elsevier Interactive Patient Education  2018 [employer]      Edwina BarthMiguel Ivie Maese, [facility]

## 2017-08-09 LAB — COMPREHENSIVE METABOLIC PANEL
A/G RATIO: 1.6 (ref 1.2–2.2)
ALT: 15 IU/L (ref 0–44)
AST: 17 IU/L (ref 0–40)
Albumin: 4.2 g/dL (ref 3.5–5.5)
Alkaline Phosphatase: 96 IU/L (ref 39–117)
BILIRUBIN TOTAL: 0.6 mg/dL (ref 0.0–1.2)
BUN/Creatinine Ratio: 19 (ref 9–20)
BUN: 14 mg/dL (ref 6–24)
CO2: 22 mmol/L (ref 20–29)
CREATININE: 0.72 mg/dL — AB (ref 0.76–1.27)
Calcium: 9.2 mg/dL (ref 8.7–10.2)
Chloride: 104 mmol/L (ref 96–106)
GFR calc Af Amer: 120 mL/min/{1.73_m2} (ref >59)
GFR, EST NON AFRICAN AMERICAN: 104 mL/min/{1.73_m2} (ref >59)
GLOBULIN, TOTAL: 2.7 g/dL (ref 1.5–4.5)
Glucose: 80 mg/dL (ref 65–99)
Potassium: 4.3 mmol/L (ref 3.5–5.2)
Sodium: 139 mmol/L (ref 134–144)
Total Protein: 6.9 g/dL (ref 6.0–8.5)

## 2017-08-09 LAB — CBC WITH DIFFERENTIAL/PLATELET
BASOS: 2 %
Basophils Absolute: 0.1 10*3/uL (ref 0.0–0.2)
EOS (ABSOLUTE): 0.4 10*3/uL (ref 0.0–0.4)
EOS: 7 %
HEMATOCRIT: 47.9 % (ref 37.5–51.0)
Hemoglobin: 15.7 g/dL (ref 13.0–17.7)
Immature Grans (Abs): 0 10*3/uL (ref 0.0–0.1)
Immature Granulocytes: 0 %
LYMPHS ABS: 2.1 10*3/uL (ref 0.7–3.1)
Lymphs: 34 %
MCH: 29 pg (ref 26.6–33.0)
MCHC: 32.8 g/dL (ref 31.5–35.7)
MCV: 89 fL (ref 79–97)
MONOS ABS: 0.4 10*3/uL (ref 0.1–0.9)
Monocytes: 7 %
NEUTROS ABS: 3.1 10*3/uL (ref 1.4–7.0)
Neutrophils: 50 %
PLATELETS: 262 10*3/uL (ref 150–450)
RBC: 5.41 x10E6/uL (ref 4.14–5.80)
RDW: 14.2 % (ref 12.3–15.4)
WBC: 6.2 10*3/uL (ref 3.4–10.8)

## 2017-08-09 LAB — HEMOGLOBIN A1C
ESTIMATED AVERAGE GLUCOSE: 111 mg/dL
Hgb A1c MFr Bld: 5.5 % (ref 4.8–5.6)

## 2017-08-09 LAB — LIPID PANEL
CHOLESTEROL TOTAL: 236 mg/dL — AB (ref 100–199)
Chol/HDL Ratio: 4.5 ratio (ref 0.0–5.0)
HDL: 53 mg/dL (ref >39)
LDL Calculated: 158 mg/dL — ABNORMAL HIGH (ref 0–99)
Triglycerides: 123 mg/dL (ref 0–149)
VLDL CHOLESTEROL CAL: 25 mg/dL (ref 5–40)

## 2017-08-09 LAB — PSA: Prostate Specific Ag, Serum: 3.9 ng/mL (ref 0.0–4.0)

## 2017-08-10 ENCOUNTER — Encounter: Payer: Self-pay | Admitting: Radiology

## 2017-09-23 ENCOUNTER — Ambulatory Visit (INDEPENDENT_AMBULATORY_CARE_PROVIDER_SITE_OTHER): Payer: PRIVATE HEALTH INSURANCE | Admitting: Emergency Medicine

## 2017-09-23 ENCOUNTER — Other Ambulatory Visit: Payer: Self-pay

## 2017-09-23 ENCOUNTER — Encounter: Payer: Self-pay | Admitting: Emergency Medicine

## 2017-09-23 VITALS — BP 128/78 | HR 65 | Temp 98.0°F | Resp 16 | Ht 72.84 in | Wt 180.0 lb

## 2017-09-23 DIAGNOSIS — G8929 Other chronic pain: Secondary | ICD-10-CM

## 2017-09-23 DIAGNOSIS — M5442 Lumbago with sciatica, left side: Secondary | ICD-10-CM

## 2017-09-23 DIAGNOSIS — G894 Chronic pain syndrome: Secondary | ICD-10-CM | POA: Diagnosis not present

## 2017-09-23 DIAGNOSIS — M48061 Spinal stenosis, lumbar region without neurogenic claudication: Secondary | ICD-10-CM

## 2017-09-23 MED ORDER — TRAMADOL HCL 50 MG PO TABS: 50.0000 mg | ORAL_TABLET | Freq: Two times a day (BID) | ORAL | 0 refills | Status: DC | PRN

## 2017-09-23 NOTE — Progress Notes (Signed)
Zachary Hester 57 y.o.   Chief Complaint  Patient presents with  . Hypertension  . Medication Refill  . Urination Problem    pt states when he goes to the restroom his urine is coming out fast     HISTORY OF PRESENT ILLNESS: This is a 57 y.o. male with history of chronic back pain for several years on tramadol twice a day for at least 2 years requesting medication refill.  I saw this patient for the first time 08/08/2017 for a routine general medical examination.  I have not been his PCP. MRI lumbar spine from 09/05/2016 reviewed.  Impression is as follows:  IMPRESSION: 1. 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 2. Severe spinal stenosis and mild-to-moderate neural foraminal stenosis at L4-5. 3. Mild-to-moderate lateral recess and neural foraminal stenosis at L5-S1. Seen by orthopedist on 08/17/2016 but no follow-up documented. Has not been to any pain management clinic.  Has been getting tramadol prescriptions from other family medicine practice group. Was referred by me to see a urologist for LUTS symptoms he was recently seen by him.  Told his prostate was fine and nothing else needed to be done regarding his urinary symptoms.  HPI   Prior to Admission medications   Medication Sig Start Date End Date Taking? Authorizing Provider  atorvastatin (LIPITOR) 20 MG tablet TAKE 1 TABLET BY MOUTH DAILY. 09/10/16  Yes Hairston, Oren BeckmannMandesia R, FNP  colloidal oatmeal PACK Apply 1 each topically daily. Daily for 14 days. 09/30/16  Yes Lizbeth BarkHairston, Mandesia R, FNP  diphenhydrAMINE (BENADRYL) 25 mg capsule Take 1 capsule (25 mg total) by mouth every 6 (six) hours as needed for itching. 09/30/16  Yes Hairston, Mandesia R, FNP  Ergocalciferol (VITAMIN D2 PO) Take 1 tablet by mouth daily.   Yes [provider]  ibuprofen (ADVIL,MOTRIN) 600 MG tablet Take 1 tablet (600 mg total) by mouth every 8 (eight) hours as  needed for moderate pain or cramping (Take with food.). 06/27/17  Yes Newlin, Enobong, MD  predniSONE (DELTASONE) 10 MG tablet DAY 1: TAKE 6 TABLETS BY MOUTH WITH MEAL; DAY 2: TAKE 5 TABLETS; DAY 3 TAKE 4 TABLETS; DAY 4 TAKE 3 TABLETS; DAY 5 TAKE 2 TABLETS; DAY 6 TAKE 1 TABLET. 09/30/16  Yes Hairston, Mandesia R, FNP  ranitidine (ZANTAC) 150 MG tablet Take 1 tablet (150 mg total) by mouth 2 (two) times daily as needed for heartburn. 06/21/16  Yes Hairston, Oren BeckmannMandesia R, FNP  traMADol (ULTRAM) 50 MG tablet Take 1 tablet (50 mg total) by mouth every 12 (twelve) hours as needed. 06/27/17  Yes Hoy RegisterNewlin, Enobong, MD  triamcinolone cream (KENALOG) 0.5 % Apply 1 application topically 2 (two) times daily. Apply to affected areas as needed for 14 days. 09/30/16  Yes Lizbeth BarkHairston, Mandesia R, FNP    No Known Allergies  Patient Active Problem List   Diagnosis Date Noted  . Spinal stenosis at L4-L5 level 06/27/2017  . Chronic bilateral low back pain with left-sided sciatica 06/28/2016  . Hypercholesteremia 06/21/2016  . Vitamin D deficiency 06/21/2016  . Heartburn 06/21/2016  . Heme positive stool 06/15/2016  . Generalized abdominal pain 06/15/2016  . Chronic back pain     Past Medical History:  Diagnosis Date  . Back pain   . Chronic back pain   . HTN (hypertension)   . Hypercholesteremia     Past Surgical History:  Procedure Laterality Date  . NO PAST SURGERIES  Social History   Socioeconomic History  . Marital status: Married    Spouse name: Not on file  . Number of children: 6  . Years of education: Not on file  . Highest education level: Not on file  Occupational History  . Occupation: [employer]  Social Needs  . Financial resource strain: Not on file  . Food insecurity:    Worry: Not on file    Inability: Not on file  . Transportation needs:    Medical: Not on file    Non-medical: Not on file  Tobacco Use  . Smoking status: Never Smoker  . Smokeless tobacco: Never Used    Substance and Sexual Activity  . Alcohol use: No  . Drug use: No  . Sexual activity: Not on file  Lifestyle  . Physical activity:    Days per week: Not on file    Minutes per session: Not on file  . Stress: Not on file  Relationships  . Social connections:    Talks on phone: Not on file    Gets together: Not on file    Attends religious service: Not on file    Active member of club or organization: Not on file    Attends meetings of clubs or organizations: Not on file    Relationship status: Not on file  . Intimate partner violence:    Fear of current or ex partner: Not on file    Emotionally abused: Not on file    Physically abused: Not on file    Forced sexual activity: Not on file  Other Topics Concern  . Not on file  Social History Narrative  . Not on file    Family History  Problem Relation Age of Onset  . Hypertension Mother   . Heart disease Sister   . Diabetes Sister      Review of Systems  Constitutional: Negative.  Negative for chills and fever.  HENT: Negative.   Respiratory: Negative.  Negative for cough.   Cardiovascular: Negative.  Negative for chest pain and palpitations.  Gastrointestinal: Negative.  Negative for abdominal pain, nausea and vomiting.  Genitourinary: Positive for frequency. Negative for dysuria and hematuria.  Musculoskeletal: Negative.   Skin: Negative.  Negative for rash.  Neurological: Negative.  Negative for dizziness and headaches.  Endo/Heme/Allergies: Negative.   All other systems reviewed and are negative.   Vitals:   09/23/17 1707  BP: 128/78  Pulse: 65  Resp: 16  Temp: 98 F (36.7 C)  SpO2: 96%    Physical Exam  Constitutional: He is oriented to person, place, and time. He appears well-developed and well-nourished.  HENT:  Head: Normocephalic and atraumatic.  Nose: Nose normal.  Mouth/Throat: Oropharynx is clear and moist.  Eyes: Pupils are equal, round, and reactive to light. Conjunctivae and EOM are normal.   Neck: Normal range of motion. Neck supple. No JVD present.  Cardiovascular: Normal rate, regular rhythm and normal heart sounds.  Pulmonary/Chest: Effort normal and breath sounds normal.  Abdominal: Soft. Bowel sounds are normal.  Lymphadenopathy:    He has no cervical adenopathy.  Neurological: He is alert and oriented to person, place, and time. No sensory deficit. He exhibits normal muscle tone.  Skin: Skin is warm and dry. Capillary refill takes less than 2 seconds.  Psychiatric: He has a normal mood and affect. His behavior is normal.  Vitals reviewed.  Chronic bilateral low back pain with left-sided sciatica Needs orthopedic follow-up and possible surgery.  Needs chronic  pain management by pain specialist. I made patient aware that tramadol is not standard of care for chronic pain.  I agreed to refill tramadol prescription one time today but advised him I will not be managing his chronic pain.  Referred to pain management clinic.    ASSESSMENT & PLAN: Zachary Hester was seen today for hypertension, medication refill and urination problem.  Diagnoses and all orders for this visit:  Chronic bilateral low back pain with left-sided sciatica -     Ambulatory referral to Orthopedic Surgery -     traMADol (ULTRAM) 50 MG tablet; Take 1 tablet (50 mg total) by mouth every 12 (twelve) hours as needed.  Spinal stenosis at L4-L5 level -     Ambulatory referral to Orthopedic Surgery -     traMADol (ULTRAM) 50 MG tablet; Take 1 tablet (50 mg total) by mouth every 12 (twelve) hours as needed.  Chronic pain syndrome -     Ambulatory referral to Pain Clinic    Patient Instructions       If you have lab work done today you will be contacted with your lab results within the next 2 weeks.  If you have not heard from us then please contact us. The fastest way to get your results is to register for My Chart.   IF you received an x-ray today, you will receive an invoice from [facility]. Please contact [facility] at [phone] with questions or concerns regarding your invoice.   IF you received labwork today, you will receive an invoice from [facility]. Please contact [facility] at [phone] with questions or concerns regarding your invoice.   Our billing staff will not be able to assist you with questions regarding bills from these companies.  You will be contacted with the lab results as soon as they are available. The fastest way to get your results is to activate your My Chart account. Instructions are located on the last page of this paperwork. If you have not heard from us regarding the results in 2 weeks, please contact this office.      Back Pain, Adult Back pain is very common. The pain often gets better over time. The cause of back pain is usually not dangerous. Most people can learn to manage their back pain on their own. Follow these instructions at home: Watch your back pain for any changes. The following actions may help to lessen any pain you are feeling:  Stay active. Start with short walks on flat ground if you can. Try to walk farther each day.  Exercise regularly as told by your doctor. Exercise helps your back heal faster. It also helps avoid future injury by keeping your muscles strong and flexible.  Do not sit, drive, or stand in one place for more than 30 minutes.  Do not stay in bed. Resting more than 1-2 days can slow down your recovery.  Be careful when you bend or lift an object. Use good form when lifting: ? Bend at your knees. ? Keep the object close to your body. ? Do not twist.  Sleep on a firm mattress. Lie on your side, and bend your knees. If you lie on your back, put a pillow under your knees.  Take medicines only as told by your doctor.  Put ice on the injured area. ? Put ice in a plastic bag. ? Place a towel between your skin and the bag. ? Leave the ice on for 20 minutes, 2-3 times a day  for the first 2-3  days. After that, you can switch between ice and heat packs.  Avoid feeling anxious or stressed. Find good ways to deal with stress, such as exercise.  Maintain a healthy weight. Extra weight puts stress on your back.  Contact a doctor if:  You have pain that does not go away with rest or medicine.  You have worsening pain that goes down into your legs or buttocks.  You have pain that does not get better in one week.  You have pain at night.  You lose weight.  You have a fever or chills. Get help right away if:  You cannot control when you poop (bowel movement) or pee (urinate).  Your arms or legs feel weak.  Your arms or legs lose feeling (numbness).  You feel sick to your stomach (nauseous) or throw up (vomit).  You have belly (abdominal) pain.  You feel like you may pass out (faint). This information is not intended to replace advice given to you by your health care provider. Make sure you discuss any questions you have with your health care provider. Document Released: 07/14/2007 Document Revised: 07/03/2015 Document Reviewed: 05/29/2013 Elsevier Interactive Patient Education  2018 Elsevier Inc.      Edwina Barth, MD [facility]

## 2017-09-23 NOTE — Patient Instructions (Addendum)
   If you have lab work done today you will be contacted with your lab results within the next 2 weeks.  If you have not heard from us then please contact us. The fastest way to get your results is to register for My Chart.   IF you received an x-ray today, you will receive an invoice from [facility]. Please contact [facility] at [phone] with questions or concerns regarding your invoice.   IF you received labwork today, you will receive an invoice from [facility]. Please contact [facility] at [phone] with questions or concerns regarding your invoice.   Our billing staff will not be able to assist you with questions regarding bills from these companies.  You will be contacted with the lab results as soon as they are available. The fastest way to get your results is to activate your My Chart account. Instructions are located on the last page of this paperwork. If you have not heard from us regarding the results in 2 weeks, please contact this office.     Back Pain, Adult Back pain is very common. The pain often gets better over time. The cause of back pain is usually not dangerous. Most people can learn to manage their back pain on their own. Follow these instructions at home: Watch your back pain for any changes. The following actions may help to lessen any pain you are feeling:  Stay active. Start with short walks on flat ground if you can. Try to walk farther each day.  Exercise regularly as told by your doctor. Exercise helps your back heal faster. It also helps avoid future injury by keeping your muscles strong and flexible.  Do not sit, drive, or stand in one place for more than 30 minutes.  Do not stay in bed. Resting more than 1-2 days can slow down your recovery.  Be careful when you bend or lift an object. Use good form when lifting: ? Bend at your knees. ? Keep the object close to your body. ? Do not twist.  Sleep on a firm mattress. Lie on your  side, and bend your knees. If you lie on your back, put a pillow under your knees.  Take medicines only as told by your doctor.  Put ice on the injured area. ? Put ice in a plastic bag. ? Place a towel between your skin and the bag. ? Leave the ice on for 20 minutes, 2-3 times a day for the first 2-3 days. After that, you can switch between ice and heat packs.  Avoid feeling anxious or stressed. Find good ways to deal with stress, such as exercise.  Maintain a healthy weight. Extra weight puts stress on your back.  Contact a doctor if:  You have pain that does not go away with rest or medicine.  You have worsening pain that goes down into your legs or buttocks.  You have pain that does not get better in one week.  You have pain at night.  You lose weight.  You have a fever or chills. Get help right away if:  You cannot control when you poop (bowel movement) or pee (urinate).  Your arms or legs feel weak.  Your arms or legs lose feeling (numbness).  You feel sick to your stomach (nauseous) or throw up (vomit).  You have belly (abdominal) pain.  You feel like you may pass out (faint). This information is not intended to replace advice given to you by your health care   provider. Make sure you discuss any questions you have with your health care provider. Document Released: 07/14/2007 Document Revised: 07/03/2015 Document Reviewed: 05/29/2013 Elsevier Interactive Patient Education  2018 Elsevier Inc.  

## 2017-09-23 NOTE — Assessment & Plan Note (Signed)
Needs orthopedic follow-up and possible surgery.  Needs chronic pain management by pain specialist. I made patient aware that tramadol is not standard of care for chronic pain.  I agreed to refill tramadol prescription one time today but advised him I will not be managing his chronic pain.  Referred to pain management clinic.

## 2017-09-26 ENCOUNTER — Other Ambulatory Visit: Payer: Self-pay | Admitting: Family Medicine

## 2017-09-26 DIAGNOSIS — M5442 Lumbago with sciatica, left side: Principal | ICD-10-CM

## 2017-09-26 DIAGNOSIS — G8929 Other chronic pain: Secondary | ICD-10-CM

## 2017-09-26 DIAGNOSIS — M48061 Spinal stenosis, lumbar region without neurogenic claudication: Secondary | ICD-10-CM

## 2017-09-26 NOTE — Telephone Encounter (Signed)
Pt called to request a medication refill -traMADol (ULTRAM) 50 MG tablet  To [facility], please follow up until his next appt.

## 2017-09-29 ENCOUNTER — Other Ambulatory Visit: Payer: Self-pay | Admitting: Family Medicine

## 2017-09-29 DIAGNOSIS — G8929 Other chronic pain: Secondary | ICD-10-CM

## 2017-09-29 DIAGNOSIS — M5442 Lumbago with sciatica, left side: Principal | ICD-10-CM

## 2017-09-29 DIAGNOSIS — M48061 Spinal stenosis, lumbar region without neurogenic claudication: Secondary | ICD-10-CM

## 2017-11-16 ENCOUNTER — Encounter: Payer: Self-pay | Admitting: Family Medicine

## 2017-11-16 ENCOUNTER — Ambulatory Visit: Payer: Self-pay | Attending: Family Medicine | Admitting: Family Medicine

## 2017-11-16 ENCOUNTER — Encounter

## 2017-11-16 DIAGNOSIS — M48061 Spinal stenosis, lumbar region without neurogenic claudication: Secondary | ICD-10-CM

## 2017-11-16 DIAGNOSIS — E7800 Pure hypercholesterolemia, unspecified: Secondary | ICD-10-CM

## 2017-11-16 DIAGNOSIS — Z79899 Other long term (current) drug therapy: Secondary | ICD-10-CM | POA: Insufficient documentation

## 2017-11-16 DIAGNOSIS — R200 Anesthesia of skin: Secondary | ICD-10-CM | POA: Insufficient documentation

## 2017-11-16 DIAGNOSIS — M5442 Lumbago with sciatica, left side: Secondary | ICD-10-CM | POA: Insufficient documentation

## 2017-11-16 DIAGNOSIS — Z791 Long term (current) use of non-steroidal anti-inflammatories (NSAID): Secondary | ICD-10-CM | POA: Insufficient documentation

## 2017-11-16 DIAGNOSIS — I10 Essential (primary) hypertension: Secondary | ICD-10-CM | POA: Insufficient documentation

## 2017-11-16 DIAGNOSIS — G8929 Other chronic pain: Secondary | ICD-10-CM

## 2017-11-16 MED ORDER — TRAMADOL HCL 50 MG PO TABS: 50.0000 mg | ORAL_TABLET | Freq: Two times a day (BID) | ORAL | 1 refills | Status: DC | PRN

## 2017-11-16 MED ORDER — ATORVASTATIN CALCIUM 20 MG PO TABS: 20.0000 mg | ORAL_TABLET | Freq: Every day | ORAL | 6 refills | Status: DC

## 2017-11-16 NOTE — Progress Notes (Signed)
Subjective:  Patient ID: Zachary Hester, male    DOB: January 16, 1961  Age: 57 y.o. MRN: [id number]  CC: Arm Pain   HPI Kashius A Hinderer  is a 57 year old male with a history of low back pain from spinal stenosis, hyperlipidemia who presents today for a follow-up visit. He was seen by pulmonary 2 months ago for lower back pain and referred to pain management and orthopedics however he is yet to obtain an appointment and review of his chart indicates attempts of the practices to contact him with no success. He has received epidural spinal injections by orthopedics with minimal improvement in symptoms.  Pain is in his left lower back and left buttock cheek and radiates down his left lower extremity with intermittent left leg numbness. He rates pain as 4/10 at this time and pain is increased by lifting. He is requesting a refill of tramadol today.  He does have hyperlipidemia but has not been taking his statin but promises to commence taking it.  Past Medical History:  Diagnosis Date  . Back pain   . Chronic back pain   . HTN (hypertension)   . Hypercholesteremia     Past Surgical History:  Procedure Laterality Date  . NO PAST SURGERIES      No Known Allergies   Outpatient Medications Prior to Visit  Medication Sig Dispense Refill  . traMADol (ULTRAM) 50 MG tablet TAKE 1 TABLET BY MOUTH EVERY 12 HOURS AS NEEDED 60 tablet 2  . colloidal oatmeal PACK Apply 1 each topically daily. Daily for 14 days. (Patient not taking: Reported on 11/16/2017) 14 packet 0  . diphenhydrAMINE (BENADRYL) 25 mg capsule Take 1 capsule (25 mg total) by mouth every 6 (six) hours as needed for itching. (Patient not taking: Reported on 11/16/2017) 30 capsule 0  . Ergocalciferol (VITAMIN D2 PO) Take 1 tablet by mouth daily.    Marland Kitchen ibuprofen (ADVIL,MOTRIN) 600 MG tablet Take 1 tablet (600 mg total) by mouth every 8 (eight) hours as needed for moderate pain or cramping (Take with food.). (Patient not taking:  Reported on 11/16/2017) 30 tablet 1  . predniSONE (DELTASONE) 10 MG tablet DAY 1: TAKE 6 TABLETS BY MOUTH WITH MEAL; DAY 2: TAKE 5 TABLETS; DAY 3 TAKE 4 TABLETS; DAY 4 TAKE 3 TABLETS; DAY 5 TAKE 2 TABLETS; DAY 6 TAKE 1 TABLET. (Patient not taking: Reported on 11/16/2017) 21 tablet 0  . ranitidine (ZANTAC) 150 MG tablet Take 1 tablet (150 mg total) by mouth 2 (two) times daily as needed for heartburn. (Patient not taking: Reported on 11/16/2017) 30 tablet 0  . triamcinolone cream (KENALOG) 0.5 % Apply 1 application topically 2 (two) times daily. Apply to affected areas as needed for 14 days. (Patient not taking: Reported on 11/16/2017) 30 g 0  . atorvastatin (LIPITOR) 20 MG tablet TAKE 1 TABLET BY MOUTH DAILY. (Patient not taking: Reported on 11/16/2017) 30 tablet 2   No facility-administered medications prior to visit.     ROS Review of Systems  Constitutional: Negative for activity change and appetite change.  HENT: Negative for sinus pressure and sore throat.   Eyes: Negative for visual disturbance.  Respiratory: Negative for cough, chest tightness and shortness of breath.   Cardiovascular: Negative for chest pain and leg swelling.  Gastrointestinal: Negative for abdominal distention, abdominal pain, constipation and diarrhea.  Endocrine: Negative.   Genitourinary: Negative for dysuria.  Musculoskeletal:       See hpi  Skin: Negative for rash.  Allergic/Immunologic: Negative.  Neurological: Negative for weakness, light-headedness and numbness.  Psychiatric/Behavioral: Negative for dysphoric mood and suicidal ideas.    Objective:  BP 124/73   Pulse 63   Temp (!) 97.5 F (36.4 C) (Oral)   Ht 6' (1.829 m)   Wt 184 lb (83.5 kg)   SpO2 99%   BMI 24.95 kg/m   BP/Weight 11/16/2017 09/23/2017 08/08/2017  Systolic BP 124 128 107  Diastolic BP 73 78 72  Wt. (Lbs) 184 180 177  BMI 24.95 23.86 23.84      Physical Exam  Constitutional: He is oriented to person, place, and time. He  appears well-developed and well-nourished.  Cardiovascular: Normal rate, normal heart sounds and intact distal pulses.  No murmur heard. Pulmonary/Chest: Effort normal and breath sounds normal. He has no wheezes. He has no rales. He exhibits no tenderness.  Abdominal: Soft. Bowel sounds are normal. He exhibits no distension and no mass. There is no tenderness.  Musculoskeletal: Normal range of motion.  No tenderness on palpation of lumbar spine; straight leg raise negative bilaterally  Neurological: He is alert and oriented to person, place, and time. He displays normal reflexes. No sensory deficit.  Skin: Skin is warm and dry.  Psychiatric: He has a normal mood and affect.     Assessment & Plan:   1. Hypercholesteremia Uncontrolled He has not been taking his statin and has been advised to restart Low-cholesterol diet - atorvastatin (LIPITOR) 20 MG tablet; Take 1 tablet (20 mg total) by mouth daily.  Dispense: 30 tablet; Refill: 6  2. Chronic bilateral low back pain with left-sided sciatica Controlled on tramadol [facility] We have provided him with the numbers to those practices as his chart previously had attempted contacting him but failed - traMADol (ULTRAM) 50 MG tablet; Take 1 tablet (50 mg total) by mouth every 12 (twelve) hours as needed.  Dispense: 60 tablet; Refill: 1  3. Spinal stenosis at L4-L5 level - traMADol (ULTRAM) 50 MG tablet; Take 1 tablet (50 mg total) by mouth every 12 (twelve) hours as needed.  Dispense: 60 tablet; Refill: 1   Meds ordered this encounter  Medications  . atorvastatin (LIPITOR) 20 MG tablet    Sig: Take 1 tablet (20 mg total) by mouth daily.    Dispense:  30 tablet    Refill:  6  . traMADol (ULTRAM) 50 MG tablet    Sig: Take 1 tablet (50 mg total) by mouth every 12 (twelve) hours as needed.    Dispense:  60 tablet    Refill:  1    Dx: spinal stenosis    Follow-up: Return in about 6 months  (around 05/18/2018) for follow up of chronic medical conditions.   Hoy Register MD

## 2017-11-16 NOTE — Patient Instructions (Addendum)
[facility] [address] [place], [place], [place] [address] [phone]    [facility]  [address], [place], [place] [address] [phone]

## 2017-12-14 ENCOUNTER — Telehealth: Payer: Self-pay | Admitting: Family Medicine

## 2017-12-14 DIAGNOSIS — G8929 Other chronic pain: Secondary | ICD-10-CM

## 2017-12-14 DIAGNOSIS — M48061 Spinal stenosis, lumbar region without neurogenic claudication: Secondary | ICD-10-CM

## 2017-12-14 DIAGNOSIS — M5442 Lumbago with sciatica, left side: Principal | ICD-10-CM

## 2017-12-14 MED ORDER — TRAMADOL HCL 50 MG PO TABS: 50.0000 mg | ORAL_TABLET | Freq: Two times a day (BID) | ORAL | 1 refills | Status: DC | PRN

## 2017-12-14 NOTE — Telephone Encounter (Signed)
Will route to PCP for review. 

## 2017-12-14 NOTE — Telephone Encounter (Signed)
Patient has requested to be prescribed tramadol. Please follow up with patient.

## 2017-12-14 NOTE — Telephone Encounter (Signed)
He was referred to pain management.  Please provide him with information from his chart so he can schedule as it seems that he had tried to get in touch with him but failed.  Tramadol refilled.

## 2018-04-14 ENCOUNTER — Other Ambulatory Visit: Payer: Self-pay | Admitting: Family Medicine

## 2018-04-14 DIAGNOSIS — M48061 Spinal stenosis, lumbar region without neurogenic claudication: Secondary | ICD-10-CM

## 2018-04-14 DIAGNOSIS — G8929 Other chronic pain: Secondary | ICD-10-CM

## 2018-04-14 DIAGNOSIS — M5442 Lumbago with sciatica, left side: Principal | ICD-10-CM

## 2018-04-14 NOTE — Telephone Encounter (Signed)
Pt last seen: 11/16/17 Next appt: n/a Last RX written on: 12/14/17 Date of original fill: 12/15/17 Date of refill(s): 01/17/18, 02/13/18  Most Recent Refill: Tramadol 50 mg written by yourself on 09/29/17, filled on 03/16/18  No other controlled substances were filled during this time, please refill if appropriate.

## 2018-05-15 ENCOUNTER — Other Ambulatory Visit: Payer: Self-pay | Admitting: Family Medicine

## 2018-05-15 DIAGNOSIS — G8929 Other chronic pain: Secondary | ICD-10-CM

## 2018-05-15 DIAGNOSIS — M5442 Lumbago with sciatica, left side: Principal | ICD-10-CM

## 2018-05-15 DIAGNOSIS — M48061 Spinal stenosis, lumbar region without neurogenic claudication: Secondary | ICD-10-CM

## 2018-06-16 ENCOUNTER — Other Ambulatory Visit: Payer: Self-pay | Admitting: Family Medicine

## 2018-06-16 DIAGNOSIS — M48061 Spinal stenosis, lumbar region without neurogenic claudication: Secondary | ICD-10-CM

## 2018-06-16 DIAGNOSIS — G8929 Other chronic pain: Secondary | ICD-10-CM

## 2018-06-21 ENCOUNTER — Ambulatory Visit (INDEPENDENT_AMBULATORY_CARE_PROVIDER_SITE_OTHER): Payer: BLUE CROSS/BLUE SHIELD | Admitting: Registered Nurse

## 2018-06-21 ENCOUNTER — Other Ambulatory Visit: Payer: Self-pay

## 2018-06-21 ENCOUNTER — Encounter: Payer: Self-pay | Admitting: Registered Nurse

## 2018-06-21 VITALS — BP 125/69 | HR 77 | Temp 98.4°F | Resp 16 | Ht 73.0 in | Wt 188.0 lb

## 2018-06-21 DIAGNOSIS — M48061 Spinal stenosis, lumbar region without neurogenic claudication: Secondary | ICD-10-CM

## 2018-06-21 DIAGNOSIS — M5442 Lumbago with sciatica, left side: Secondary | ICD-10-CM | POA: Diagnosis not present

## 2018-06-21 DIAGNOSIS — E785 Hyperlipidemia, unspecified: Secondary | ICD-10-CM

## 2018-06-21 DIAGNOSIS — G8929 Other chronic pain: Secondary | ICD-10-CM

## 2018-06-21 DIAGNOSIS — Z7689 Persons encountering health services in other specified circumstances: Secondary | ICD-10-CM

## 2018-06-21 MED ORDER — ATORVASTATIN CALCIUM 20 MG PO TABS: 20.0000 mg | ORAL_TABLET | Freq: Every day | ORAL | 2 refills | Status: DC

## 2018-06-21 NOTE — Progress Notes (Signed)
Established Patient Office Visit  Subjective:  Patient ID: Zachary Hester, male    DOB: October 17, 1960  Age: 58 y.o. MRN: [id number]  CC:  Chief Complaint  Patient presents with  . Hyperlipidemia    pt states he needs to follow-up on his HLD    HPI Zachary Hester presents to establish care with myself as PCP, medication refill, referrals. Hx significant for HLD, back pain w/ sciatica.  Pt had [facility] and Dr. Alvis LemmingsNewlin. The patient stated that he wished to establish care here today, though unsure if he understands the role of PCP. Will continue to explore this at future visits.  HLD: Labs today. Reorder atorvastatin 20mg  in case patient does not follow up with CPE - pt is currently fasting as it is Ramadan  Lower back pain w/ LLE sciatica: Patient has been referred to pain management and ortho. Explained the referral process to the patient today. He received a refill of tramadol from Dr. Alvis LemmingsNewlin on 06/16/18 - no rx given today - understands our office will not fill this medication and that he needs to be seen by ortho and pain med.  Past Medical History:  Diagnosis Date  . Back pain   . Chronic back pain   . HTN (hypertension)   . Hypercholesteremia     Past Surgical History:  Procedure Laterality Date  . NO PAST SURGERIES      Family History  Problem Relation Age of Onset  . Hypertension Mother   . Heart disease Sister   . Diabetes Sister     Social History   Socioeconomic History  . Marital status: Married    Spouse name: Not on file  . Number of children: 6  . Years of education: Not on file  . Highest education level: Not on file  Occupational History  . Occupation: [employer]  Social Needs  . Financial resource strain: Not hard at all  . Food insecurity:    Worry: Never true    Inability: Never true  . Transportation needs:    Medical: No    Non-medical: No  Tobacco Use  . Smoking status: Never  Smoker  . Smokeless tobacco: Never Used  Substance and Sexual Activity  . Alcohol use: No  . Drug use: No  . Sexual activity: Yes  Lifestyle  . Physical activity:    Days per week: 0 days    Minutes per session: 0 min  . Stress: Not at all  Relationships  . Social connections:    Talks on phone: Once a week    Gets together: Once a week    Attends religious service: More than 4 times per year    Active member of club or organization: Yes    Attends meetings of clubs or organizations: Never    Relationship status: Married  . Intimate partner violence:    Fear of current or ex partner: No    Emotionally abused: No    Physically abused: No    Forced sexual activity: No  Other Topics Concern  . Not on file  Social History Narrative  . Not on file    Outpatient Medications Prior to Visit  Medication Sig Dispense Refill  . atorvastatin (LIPITOR) 20 MG tablet Take 1 tablet (20 mg total) by mouth daily. 30 tablet 6  . Ergocalciferol (VITAMIN D2 PO) Take 1 tablet by mouth daily.    . predniSONE (DELTASONE) 10 MG  tablet DAY 1: TAKE 6 TABLETS BY MOUTH WITH MEAL; DAY 2: TAKE 5 TABLETS; DAY 3 TAKE 4 TABLETS; DAY 4 TAKE 3 TABLETS; DAY 5 TAKE 2 TABLETS; DAY 6 TAKE 1 TABLET. 21 tablet 0  . traMADol (ULTRAM) 50 MG tablet TAKE 1 TABLET BY MOUTH EVERY 12 HOURS AS NEEDED 60 tablet 0  . colloidal oatmeal PACK Apply 1 each topically daily. Daily for 14 days. (Patient not taking: Reported on 11/16/2017) 14 packet 0  . diphenhydrAMINE (BENADRYL) 25 mg capsule Take 1 capsule (25 mg total) by mouth every 6 (six) hours as needed for itching. (Patient not taking: Reported on 11/16/2017) 30 capsule 0  . ibuprofen (ADVIL,MOTRIN) 600 MG tablet Take 1 tablet (600 mg total) by mouth every 8 (eight) hours as needed for moderate pain or cramping (Take with food.). (Patient not taking: Reported on 11/16/2017) 30 tablet 1  . ranitidine (ZANTAC) 150 MG tablet Take 1 tablet (150 mg total) by mouth 2 (two) times daily  as needed for heartburn. (Patient not taking: Reported on 11/16/2017) 30 tablet 0  . triamcinolone cream (KENALOG) 0.5 % Apply 1 application topically 2 (two) times daily. Apply to affected areas as needed for 14 days. (Patient not taking: Reported on 11/16/2017) 30 g 0   No facility-administered medications prior to visit.     No Known Allergies  ROS Review of Systems  Constitutional: Negative.   HENT: Negative.   Eyes: Negative.   Respiratory: Negative.   Cardiovascular: Negative.   Gastrointestinal: Negative.   Endocrine: Negative.   Genitourinary: Negative.   Musculoskeletal: Positive for back pain.  Skin: Negative.   Allergic/Immunologic: Negative.   Neurological: Negative.   Hematological: Negative.   Psychiatric/Behavioral: Negative.   All other systems reviewed and are negative.     Objective:    Physical Exam  Constitutional: He is oriented to person, place, and time. He appears well-developed and well-nourished.  HENT:  Head: Normocephalic and atraumatic.  Neck: Normal range of motion.  Cardiovascular: Normal rate, regular rhythm and normal heart sounds. Exam reveals no gallop and no friction rub.  No murmur heard. Pulmonary/Chest: Effort normal and breath sounds normal. No respiratory distress. He has no wheezes. He has no rales. He exhibits no tenderness.  Musculoskeletal: Normal range of motion.  Neurological: He is alert and oriented to person, place, and time.  Skin: Skin is warm and dry.  Psychiatric: He has a normal mood and affect. His behavior is normal. Judgment and thought content normal.    BP 125/69   Pulse 77   Temp 98.4 F (36.9 C) (Oral)   Resp 16   Ht 6\' 1"  (1.854 m)   Wt 188 lb (85.3 kg)   SpO2 98%   BMI 24.80 kg/m  Wt Readings from Last 3 Encounters:  06/21/18 188 lb (85.3 kg)  11/16/17 184 lb (83.5 kg)  09/23/17 180 lb (81.6 kg)     There are no preventive care reminders to display for this patient.  There are no preventive  care reminders to display for this patient.  No results found for: TSH Lab Results  Component Value Date   WBC 6.2 08/08/2017   HGB 15.7 08/08/2017   HCT 47.9 08/08/2017   MCV 89 08/08/2017   PLT 262 08/08/2017   Lab Results  Component Value Date   NA 139 08/08/2017   K 4.3 08/08/2017   CO2 22 08/08/2017   GLUCOSE 80 08/08/2017   BUN 14 08/08/2017   CREATININE 0.72 (L)  08/08/2017   BILITOT 0.6 08/08/2017   ALKPHOS 96 08/08/2017   AST 17 08/08/2017   ALT 15 08/08/2017   PROT 6.9 08/08/2017   ALBUMIN 4.2 08/08/2017   CALCIUM 9.2 08/08/2017   ANIONGAP 10 01/24/2014   Lab Results  Component Value Date   CHOL 236 (H) 08/08/2017   Lab Results  Component Value Date   HDL 53 08/08/2017   Lab Results  Component Value Date   LDLCALC 158 (H) 08/08/2017   Lab Results  Component Value Date   TRIG 123 08/08/2017   Lab Results  Component Value Date   CHOLHDL 4.5 08/08/2017   Lab Results  Component Value Date   HGBA1C 5.5 08/08/2017      Assessment & Plan:   Problem List Items Addressed This Visit      Nervous and Auditory   Chronic bilateral low back pain with left-sided sciatica   Relevant Orders   Ambulatory referral to Orthopedic Surgery   Ambulatory referral to Pain Clinic     Other   Spinal stenosis at L4-L5 level   Relevant Orders   Ambulatory referral to Orthopedic Surgery   Ambulatory referral to Pain Clinic    Other Visit Diagnoses    Encounter to establish care    -  Primary   Relevant Orders   CBC   Comprehensive metabolic panel   Hemoglobin A1c   Lipid panel   Hyperlipidemia, unspecified hyperlipidemia type       Relevant Orders   CBC   Comprehensive metabolic panel   Hemoglobin A1c   Lipid panel      Meds ordered this encounter  Medications  . atorvastatin (LIPITOR) 20 MG tablet    Sig: Take 1 tablet (20 mg total) by mouth daily.    Dispense:  60 tablet    Refill:  2    Order Specific Question:   Supervising Provider     Answer:   Doristine Bosworth [id number]    Follow-up: Return in about 3 months (around 09/21/2018). for CPE  PLAN:  Continue atorvastatin  po daily. Labs checked today, will follow up as warranted.  Back pain: tramadol recently given by Dr. Alvis Lemmings. No rx given today. Pt understands that our practice will not refill this rx - he needs to be seen by pain med and ortho as per myself and Dr. Alvis Lemmings.   Return to clinic within next 3 mos for CPE  Patient encouraged to call clinic with any questions, comments, or concerns.  I spent 18 minutes with this patient, more than 50% of which was spent educating/counseling.   Thank you for your visit with Primary Care at [facility] today.  Janeece Agee, NP [facility] at [facility] [address] [place], [place] [address] [phone]

## 2018-06-21 NOTE — Patient Instructions (Signed)
° ° ° °  If you have lab work done today you will be contacted with your lab results within the next 2 weeks.  If you have not heard from us then please contact us. The fastest way to get your results is to register for My Chart. ° ° °IF you received an x-ray today, you will receive an invoice from [facility]. Please contact [facility] at [phone] with questions or concerns regarding your invoice.  ° °IF you received labwork today, you will receive an invoice from [facility]. Please contact [facility] at [phone] with questions or concerns regarding your invoice.  ° °Our billing staff will not be able to assist you with questions regarding bills from these companies. ° °You will be contacted with the lab results as soon as they are available. The fastest way to get your results is to activate your My Chart account. Instructions are located on the last page of this paperwork. If you have not heard from us regarding the results in 2 weeks, please contact this office. °  ° ° ° °

## 2018-06-22 LAB — COMPREHENSIVE METABOLIC PANEL
ALT: 17 IU/L (ref 0–44)
AST: 18 IU/L (ref 0–40)
Albumin/Globulin Ratio: 1.6 (ref 1.2–2.2)
Albumin: 4.3 g/dL (ref 3.8–4.9)
Alkaline Phosphatase: 103 IU/L (ref 39–117)
BUN/Creatinine Ratio: 16 (ref 9–20)
BUN: 12 mg/dL (ref 6–24)
Bilirubin Total: 0.7 mg/dL (ref 0.0–1.2)
CO2: 19 mmol/L — ABNORMAL LOW (ref 20–29)
Calcium: 9.1 mg/dL (ref 8.7–10.2)
Chloride: 104 mmol/L (ref 96–106)
Creatinine, Ser: 0.76 mg/dL (ref 0.76–1.27)
GFR calc Af Amer: 116 mL/min/{1.73_m2} (ref >59)
GFR calc non Af Amer: 101 mL/min/{1.73_m2} (ref >59)
Globulin, Total: 2.7 g/dL (ref 1.5–4.5)
Glucose: 92 mg/dL (ref 65–99)
Potassium: 4.4 mmol/L (ref 3.5–5.2)
Sodium: 138 mmol/L (ref 134–144)
Total Protein: 7 g/dL (ref 6.0–8.5)

## 2018-06-22 LAB — CBC
Hematocrit: 43.3 % (ref 37.5–51.0)
Hemoglobin: 14.9 g/dL (ref 13.0–17.7)
MCH: 29.9 pg (ref 26.6–33.0)
MCHC: 34.4 g/dL (ref 31.5–35.7)
MCV: 87 fL (ref 79–97)
Platelets: 275 10*3/uL (ref 150–450)
RBC: 4.99 x10E6/uL (ref 4.14–5.80)
RDW: 13.1 % (ref 11.6–15.4)
WBC: 5.9 10*3/uL (ref 3.4–10.8)

## 2018-06-22 LAB — LIPID PANEL
Chol/HDL Ratio: 3.8 ratio (ref 0.0–5.0)
Cholesterol, Total: 177 mg/dL (ref 100–199)
HDL: 47 mg/dL (ref >39)
LDL Calculated: 110 mg/dL — ABNORMAL HIGH (ref 0–99)
Triglycerides: 100 mg/dL (ref 0–149)
VLDL Cholesterol Cal: 20 mg/dL (ref 5–40)

## 2018-06-22 LAB — HEMOGLOBIN A1C
Est. average glucose Bld gHb Est-mCnc: 120 mg/dL
Hgb A1c MFr Bld: 5.8 % — ABNORMAL HIGH (ref 4.8–5.6)

## 2018-07-06 ENCOUNTER — Telehealth: Payer: Self-pay | Admitting: Registered Nurse

## 2018-07-06 NOTE — Telephone Encounter (Signed)
Copied from CRM [id number]. Topic: General - Other >> Jul 05, 2018  4:03 PM Herby Abraham C wrote: Reason for CRM: pt says that he seen provider [facility] and requested a refill on his Tramadol. Advised pt that Im not showing a Rx was sent in at visit. Showing PCP Newlin sent in Rx on 06/16/18, pt says that he is not sure of who Dr. Alvis Lemmings is. Advised pt of that Rx info. Pt says that he would like to have provider Murrow to send in Rx to [facility] on [address].

## 2018-07-07 ENCOUNTER — Other Ambulatory Visit: Payer: Self-pay | Admitting: Registered Nurse

## 2018-07-07 ENCOUNTER — Ambulatory Visit: Payer: Self-pay | Admitting: Orthopaedic Surgery

## 2018-07-07 ENCOUNTER — Telehealth: Payer: Self-pay | Admitting: Registered Nurse

## 2018-07-07 DIAGNOSIS — M48061 Spinal stenosis, lumbar region without neurogenic claudication: Secondary | ICD-10-CM

## 2018-07-07 DIAGNOSIS — G8929 Other chronic pain: Secondary | ICD-10-CM

## 2018-07-07 MED ORDER — TRAMADOL HCL 50 MG PO TABS: 50.0000 mg | ORAL_TABLET | Freq: Two times a day (BID) | ORAL | 0 refills | Status: DC | PRN

## 2018-07-07 NOTE — Telephone Encounter (Signed)
Attempted to call pt and there was no answer so I left a message to call back.   Pt was last seen on 06/21/18 to Establish Care and the provider plan regarding the Tramadol medication is below.   Plan  Back pain: tramadol recently given by Dr. Alvis Lemmings. No rx given today. Pt understands that our practice will not refill this rx - he needs to be seen by pain med and ortho as per myself and Dr. Alvis Lemmings.

## 2018-07-07 NOTE — Telephone Encounter (Signed)
PT called to return Mei's phone call in regards to his prescription.

## 2018-07-07 NOTE — Progress Notes (Signed)
Pt reports the [facility] he was previously seen at is no longer open and he was unable to get his prescription. I have sent it to [facility] at [facility] and [address].  Jari Sportsman, NP

## 2018-07-07 NOTE — Telephone Encounter (Signed)
Attempted to call pt and there was no answer so I left a message to call back and we are unable to refill tramadol.   Pt was last seen on 06/21/18 to Establish Care and the provider plan regarding the Tramadol medication is below.   Plan  Back pain: tramadol recently given by Dr. Alvis Lemmings. No rx given today. Pt understands that our practice will not refill this rx - he needs to be seen by pain med and ortho as per myself and Dr. Alvis Lemmings.

## 2018-07-25 ENCOUNTER — Other Ambulatory Visit: Payer: Self-pay

## 2018-07-25 ENCOUNTER — Encounter: Payer: Self-pay | Admitting: Orthopaedic Surgery

## 2018-07-25 ENCOUNTER — Ambulatory Visit (INDEPENDENT_AMBULATORY_CARE_PROVIDER_SITE_OTHER): Payer: BC Managed Care – PPO | Admitting: Orthopaedic Surgery

## 2018-07-25 ENCOUNTER — Ambulatory Visit: Payer: Self-pay

## 2018-07-25 VITALS — Ht 74.0 in | Wt 196.0 lb

## 2018-07-25 DIAGNOSIS — G8929 Other chronic pain: Secondary | ICD-10-CM

## 2018-07-25 DIAGNOSIS — M5136 Other intervertebral disc degeneration, lumbar region: Secondary | ICD-10-CM

## 2018-07-25 DIAGNOSIS — M5416 Radiculopathy, lumbar region: Secondary | ICD-10-CM | POA: Diagnosis not present

## 2018-07-25 DIAGNOSIS — M545 Low back pain: Secondary | ICD-10-CM | POA: Diagnosis not present

## 2018-07-25 NOTE — Progress Notes (Signed)
Office Visit Note   Patient: Zachary Hester           Date of Birth: 04-06-1960           MRN: [id number] Visit Date: 07/25/2018              Requested by: Maximiano Coss, NP [place],  [place] [address] PCP: Charlott Rakes, MD   Assessment & Plan: Visit Diagnoses:  1. Chronic bilateral low back pain, unspecified whether sciatica present   2. Lumbar radiculopathy   3. Other intervertebral disc degeneration, lumbar region     Plan: With patient's ongoing worsening low back pain and left greater than right lower extremity radiculopathy, neurogenic claudication and left lower extremity weakness I recommend repeating lumbar MRI and comparing this to the study that was done July 2018.  Follow-up with Dr. Lorin Mercy after completion to discuss results and further treatment options.  He has failed conservative treatment with lumbar ESI's, oral NSAIDs, tramadol.  Follow-Up Instructions: Return in about 2 weeks (around 08/08/2018) for with Dr Lorin Mercy to review Lumbar MRI.   Orders:  Orders Placed This Encounter  Procedures  . XR Lumbar Spine 2-3 Views  . MR Lumbar Spine w/o contrast   No orders of the defined types were placed in this encounter.     Procedures: No procedures performed   Clinical Data: No additional findings.   Subjective: Chief Complaint  Patient presents with  . Lower Back - Pain    HPI 58 year old black male comes in today with complaints of worsening low back pain and left greater than right lower extremity radiculopathy, neurogenic claudication and left lower extremity weakness.  He has previously been seen by Dr. Erlinda Hong in 2018 for low back pain and left lower extremity radiculopathy.  He was sent for lumbar MRI which was completed September 05, 2016.  That report showed:  EXAM: MRI LUMBAR SPINE WITHOUT CONTRAST  TECHNIQUE: Multiplanar, multisequence MR imaging of the lumbar spine was performed. No intravenous contrast was administered.   COMPARISON:  Lumbar spine radiographs 07/08/2016  FINDINGS: Segmentation:  Standard.  Alignment: Chronic lumbar spine straightening and 5 mm retrolisthesis of L5 on S1.  Vertebrae: No fracture or suspicious osseous lesion. Prominent degenerative endplate marrow changes at L4-5 and L5-S1. Moderate degenerative edema at L4-5.  Conus medullaris: Extends to the upper L1 level and appears normal.  Paraspinal and other soft tissues: Unremarkable.  Disc levels:  Congenitally short pedicles result in mild diffuse narrowing of the lumbar spinal canal.  T12-L1 through L3-4:  Negative.  L4-5: Circumferential disc bulging, a broad central disc protrusion with annular fissure, congenitally short pedicles, and mild-to-moderate facet and ligamentum flavum hypertrophy result in severe spinal stenosis and mild-to-moderate bilateral neural foraminal stenosis.  L5-S1: Moderate to severe disc space narrowing. Circumferential disc bulging, broad central disc protrusion, and mild facet hypertrophy result in mild-to-moderate bilateral lateral recess stenosis, mild spinal stenosis, and mild right and mild-to-moderate left neural foraminal stenosis.  IMPRESSION: 1. 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 2. Severe spinal stenosis and mild-to-moderate neural foraminal stenosis at L4-5. 3. Mild-to-moderate lateral recess and neural foraminal stenosis at L5-S1.  He went for lumbar ESI's with Dr. Ernestina Patches and these did not give much improvement.  He has been followed by his primary care physician for ongoing issues.  Has been prescribed tramadol.  He describes constant low back pain and off-and-on left greater than right lower extremity radiculopathy.  He also describes neurogenic claudication symptoms when he is walking the grocery store he does have to lean forward over the cart in attempt to relieve  symptoms.  He still continues to work and just pushes through.  Symptoms aggravated with sitting, standing, bending, twisting.  No complaints of bowel or bladder incontinence.  He does feel like he has some left leg weakness.  Review of Systems No current cardiac pulmonary GI GU issues  Objective: Vital Signs: Ht 6\' 2"  (1.88 m)   Wt 196 lb (88.9 kg)   BMI 25.16 kg/m   Physical Exam HENT:     Head: Normocephalic.  Eyes:     Extraocular Movements: Extraocular movements intact.     Pupils: Pupils are equal, round, and reactive to light.  Pulmonary:     Effort: Pulmonary effort is normal. No respiratory distress.  Musculoskeletal:     Comments: Gait is somewhat antalgic.  Mild lumbar paraspinal tenderness.  Negative log bilateral hips.  Negative straight leg raise.  Bilateral calves are nontender.  He does have trace left anterior tib weakness.  All other strength maneuvers within normal limits.  Skin:    General: Skin is warm and dry.  Neurological:     Mental Status: He is alert and oriented to person, place, and time.  Psychiatric:        Mood and Affect: Mood normal.     Ortho Exam  Specialty Comments:  No specialty comments available.  Imaging: No results found.   PMFS History: Patient Active Problem List   Diagnosis Date Noted  . Chronic pain syndrome 09/23/2017  . Spinal stenosis at L4-L5 level 06/27/2017  . Chronic bilateral low back pain with left-sided sciatica 06/28/2016  . Hypercholesteremia 06/21/2016  . Vitamin D deficiency 06/21/2016  . Heartburn 06/21/2016  . Heme positive stool 06/15/2016  . Generalized abdominal pain 06/15/2016  . Chronic back pain    Past Medical History:  Diagnosis Date  . Back pain   . Chronic back pain   . HTN (hypertension)   . Hypercholesteremia     Family History  Problem Relation Age of Onset  . Hypertension Mother   . Heart disease Sister   . Diabetes Sister     Past Surgical History:  Procedure Laterality Date   . NO PAST SURGERIES     Social History   Occupational History  . Occupation: [employer]  Tobacco Use  . Smoking status: Never Smoker  . Smokeless tobacco: Never Used  Substance and Sexual Activity  . Alcohol use: No  . Drug use: No  . Sexual activity: Yes

## 2018-07-26 ENCOUNTER — Telehealth: Payer: Self-pay | Admitting: *Deleted

## 2018-07-26 NOTE — Telephone Encounter (Signed)
Pt is scheduled at [facility] at [address] Dr. Rondall Allegra on Friday June 19th at 6:00pm with arrival at 5:30pm. Pt was scheduled thru [employer] [facility] to get 100% covered. Left message on vm to return my call for appt

## 2018-07-27 NOTE — Progress Notes (Signed)
CALLED PT TO SCHEDULE PHYSICAL FOR AUGUST . PT WANTS TO THINK ABOUT AND CALL us BACK fr

## 2018-07-29 ENCOUNTER — Encounter (HOSPITAL_BASED_OUTPATIENT_CLINIC_OR_DEPARTMENT_OTHER): Payer: Self-pay | Admitting: Emergency Medicine

## 2018-07-29 ENCOUNTER — Emergency Department (HOSPITAL_BASED_OUTPATIENT_CLINIC_OR_DEPARTMENT_OTHER): Payer: No Typology Code available for payment source

## 2018-07-29 ENCOUNTER — Emergency Department (HOSPITAL_BASED_OUTPATIENT_CLINIC_OR_DEPARTMENT_OTHER)
Admission: EM | Admit: 2018-07-29 | Discharge: 2018-07-29 | Disposition: A | Discharge status: Home / Self Care | Payer: No Typology Code available for payment source | Attending: Emergency Medicine | Admitting: Emergency Medicine

## 2018-07-29 ENCOUNTER — Other Ambulatory Visit: Payer: Self-pay

## 2018-07-29 DIAGNOSIS — S92902A Unspecified fracture of left foot, initial encounter for closed fracture: Secondary | ICD-10-CM

## 2018-07-29 DIAGNOSIS — Y990 Civilian activity done for income or pay: Secondary | ICD-10-CM | POA: Insufficient documentation

## 2018-07-29 DIAGNOSIS — S92355A Nondisplaced fracture of fifth metatarsal bone, left foot, initial encounter for closed fracture: Secondary | ICD-10-CM | POA: Insufficient documentation

## 2018-07-29 DIAGNOSIS — Y9301 Activity, walking, marching and hiking: Secondary | ICD-10-CM | POA: Insufficient documentation

## 2018-07-29 DIAGNOSIS — Y9289 Other specified places as the place of occurrence of the external cause: Secondary | ICD-10-CM | POA: Diagnosis not present

## 2018-07-29 DIAGNOSIS — X500XXA Overexertion from strenuous movement or load, initial encounter: Secondary | ICD-10-CM | POA: Insufficient documentation

## 2018-07-29 DIAGNOSIS — S99922A Unspecified injury of left foot, initial encounter: Secondary | ICD-10-CM | POA: Diagnosis present

## 2018-07-29 DIAGNOSIS — I10 Essential (primary) hypertension: Secondary | ICD-10-CM | POA: Diagnosis not present

## 2018-07-29 MED ORDER — IBUPROFEN 600 MG PO TABS: 600.0000 mg | ORAL_TABLET | Freq: Four times a day (QID) | ORAL | 0 refills | Status: DC | PRN

## 2018-07-29 MED ORDER — MORPHINE SULFATE 15 MG PO TABS: 15.0000 mg | ORAL_TABLET | Freq: Four times a day (QID) | ORAL | 0 refills | Status: DC | PRN

## 2018-07-29 NOTE — Discharge Instructions (Signed)
You were seen in the emergency department today with foot pain.  We found that you have a fracture on the outside of your left foot.  You will need to keep weight off of this foot.  You may use the shoe provided for support or use other hard soled shoe.  Use the crutches as instructed to prevent injury to the underarms.  Ideally, use Tylenol and/or Motrin for pain.  Keep the foot elevated and apply ice intermittently over the next 12 hours to alleviate pain.  You may take the morphine tablets as directed for severe pain but only if needed.  Call the orthopedic surgeon first thing on Monday to schedule a follow-up appointment in the next week.  Return to the emergency department any new or suddenly worsening symptoms.

## 2018-07-29 NOTE — ED Triage Notes (Signed)
Pt presents with lateral left foot pain after feeling a pop in his foot at work this morning.

## 2018-07-29 NOTE — ED Provider Notes (Signed)
Emergency Department Provider Note   I have reviewed the triage vital signs and the nursing notes.   HISTORY  Chief Complaint Foot Pain   HPI Zachary Hester is a 58 y.o. male with PMH of HLD and HTN presents to the emergency department for evaluation of left lateral foot pain.  Patient states he was at work and walking when he felt a "pop" along with pain along the left lateral foot.  He was not lifting or making sudden movements.  He is not experiencing pain in the ankle or knee.  No hip discomfort.  No lower back pain.  Denies any weakness or numbness.  Pain is worse with walking on the foot or movement.  Patient with pain to touching the outside of the left foot.   Past Medical History:  Diagnosis Date  . Back pain   . Chronic back pain   . HTN (hypertension)   . Hypercholesteremia     Patient Active Problem List   Diagnosis Date Noted  . Chronic pain syndrome 09/23/2017  . Spinal stenosis at L4-L5 level 06/27/2017  . Chronic bilateral low back pain with left-sided sciatica 06/28/2016  . Hypercholesteremia 06/21/2016  . Vitamin D deficiency 06/21/2016  . Heartburn 06/21/2016  . Heme positive stool 06/15/2016  . Generalized abdominal pain 06/15/2016  . Chronic back pain     Past Surgical History:  Procedure Laterality Date  . NO PAST SURGERIES      Allergies Patient has no known allergies.  Family History  Problem Relation Age of Onset  . Hypertension Mother   . Heart disease Sister   . Diabetes Sister     Social History Social History   Tobacco Use  . Smoking status: Never Smoker  . Smokeless tobacco: Never Used  Substance Use Topics  . Alcohol use: No  . Drug use: No    Review of Systems  Constitutional: No fever/chills Musculoskeletal: Negative for back pain. Positive left foot pain.  Skin: Negative for rash. Neurological: Negative for headaches, focal weakness or numbness.  10-point ROS otherwise negative.   ____________________________________________   PHYSICAL EXAM:  VITAL SIGNS: ED Triage Vitals [07/29/18 0812]  Enc Vitals Group     BP (!) 174/94     Pulse Rate 75     Resp 16     Temp 98.3 F (36.8 C)     Temp Source Oral     SpO2 100 %     Weight 192 lb (87.1 kg)     Height 6\' 1"  (1.854 m)     Pain Score 7   Constitutional: Alert and oriented. Well appearing and in no acute distress. Eyes: Conjunctivae are normal. Head: Atraumatic. Nose: No congestion/rhinnorhea. Mouth/Throat: Mucous membranes are moist.  Neck: No stridor.  Cardiovascular: Good peripheral circulation. Strong DP/PT pulses on the left.  Respiratory: Normal respiratory effort.  Gastrointestinal: No distention.  Musculoskeletal: Tenderness to palpation over the left lateral foot.  No lacerations or abrasions.  No tenderness to palpation of the medial or lateral malleoli.  Proximal fibula and knee are nontender to palpation.  Normal range of motion of the ankle and knee joints.  Neurologic:  Normal speech and language. Normal sensation in the left foot.  Skin:  Skin is warm, dry and intact. No rash noted.  ____________________________________________  RADIOLOGY  Dg Foot Complete Left  Result Date: 07/29/2018 CLINICAL DATA:  Lateral left foot pain after injury this morning EXAM: LEFT FOOT - COMPLETE 3+ VIEW COMPARISON:  None.  FINDINGS: Acute non articular fracture of the proximal left fifth metatarsal, nondisplaced. Overlying soft tissue swelling. No additional fracture. No dislocation. No suspicious focal osseous lesion. Mild first MTP joint osteoarthritis. No radiopaque foreign body. IMPRESSION: Acute nondisplaced non articular proximal left fifth metatarsal fracture. Electronically Signed   By: Delbert PhenixJason A Poff M.D.   On: 07/29/2018 08:54    ____________________________________________   PROCEDURES  Procedure(s) performed:   Procedures  None  ____________________________________________   INITIAL  IMPRESSION / ASSESSMENT AND PLAN / ED COURSE  Pertinent labs & imaging results that were available during my care of the patient were reviewed by me and considered in my medical decision making (see chart for details).   Patient presents to the emergency department with left foot pain after walking.  Patient was at work during the injury.  Pain to palpation over the left lateral foot.  Plan for plain film to rule out left metatarsal fracture.   09:00 AM  Patient with a left metatarsal fracture.  Fracture is not open.  Provided postop shoe and crutches.  Will keep patient nonweightbearing, elevate extremity, and apply ice.  Morphine tablets provided for severe pain as needed at home.  Patient to call orthopedics on Monday to schedule a follow-up appointment for possible operative intervention.  Patient understands instructions and ED return precautions. [place] drug database reviewed.  ____________________________________________  FINAL CLINICAL IMPRESSION(S) / ED DIAGNOSES  Final diagnoses:  Closed fracture of left foot, initial encounter    NEW OUTPATIENT MEDICATIONS STARTED DURING THIS VISIT:  New Prescriptions   IBUPROFEN (ADVIL) 600 MG TABLET    Take 1 tablet (600 mg total) by mouth every 6 (six) hours as needed for moderate pain.   MORPHINE (MSIR) 15 MG TABLET    Take 1 tablet (15 mg total) by mouth every 6 (six) hours as needed for severe pain.    Note:  This document was prepared using Dragon voice recognition software and may include unintentional dictation errors.  Alona BeneJoshua , MD Emergency Medicine    , Arlyss RepressJoshua G, MD 07/29/18 [phone]

## 2018-08-03 ENCOUNTER — Ambulatory Visit: Payer: BC Managed Care – PPO | Admitting: Emergency Medicine

## 2018-08-04 ENCOUNTER — Encounter: Payer: Self-pay | Admitting: Emergency Medicine

## 2018-08-08 ENCOUNTER — Ambulatory Visit: Payer: BC Managed Care – PPO | Admitting: Orthopaedic Surgery

## 2018-08-08 ENCOUNTER — Telehealth: Payer: Self-pay | Admitting: Family Medicine

## 2018-08-08 ENCOUNTER — Other Ambulatory Visit: Payer: Self-pay

## 2018-08-08 NOTE — Telephone Encounter (Signed)
1) Medication(s) Requested (by name): TRAMADOL  2) Pharmacy of Choice: [email or web address] 3) Special Requests:   Approved medications will be sent to the pharmacy, we will reach out if there is an issue.  Requests made after 3pm may not be addressed until the following business day!  If a patient is unsure of the name of the medication(s) please note and ask patient to call back when they are able to provide all info, do not send to responsible party until all information is available!

## 2018-08-09 NOTE — Telephone Encounter (Signed)
Tramadol denied.  He does have a pain agreement with this clinic however he has been receiving tramadol from other prescribers and this has violated his pain agreement.

## 2018-08-10 NOTE — Telephone Encounter (Signed)
Patient was called and informed that medication has been denied by pcp

## 2018-08-22 ENCOUNTER — Ambulatory Visit: Payer: BC Managed Care – PPO | Attending: Family Medicine | Admitting: Family Medicine

## 2018-08-22 ENCOUNTER — Encounter: Payer: Self-pay | Admitting: Family Medicine

## 2018-08-22 ENCOUNTER — Other Ambulatory Visit: Payer: Self-pay

## 2018-08-22 VITALS — BP 147/91 | HR 80 | Ht 74.0 in | Wt 189.0 lb

## 2018-08-22 DIAGNOSIS — M48061 Spinal stenosis, lumbar region without neurogenic claudication: Secondary | ICD-10-CM | POA: Diagnosis not present

## 2018-08-22 DIAGNOSIS — E7800 Pure hypercholesterolemia, unspecified: Secondary | ICD-10-CM

## 2018-08-22 MED ORDER — GABAPENTIN 300 MG PO CAPS: 300.0000 mg | ORAL_CAPSULE | Freq: Every day | ORAL | 3 refills | Status: DC

## 2018-08-22 MED ORDER — DULOXETINE HCL 60 MG PO CPEP: 60.0000 mg | ORAL_CAPSULE | Freq: Every day | ORAL | 3 refills | Status: DC

## 2018-08-22 MED ORDER — ATORVASTATIN CALCIUM 20 MG PO TABS: 20.0000 mg | ORAL_TABLET | Freq: Every day | ORAL | 6 refills | Status: DC

## 2018-08-22 NOTE — Progress Notes (Signed)
Patient would like to discuss tramadol prescription.

## 2018-08-22 NOTE — Patient Instructions (Signed)
Spinal Stenosis ° °Spinal stenosis happens when the open space (spinal canal) between the bones of your spine (vertebrae) gets smaller. It is caused by bone pushing into the open spaces of your backbone (spine). This puts pressure on your backbone and the nerves in your backbone. Treatment often focuses on managing any pain and symptoms. In some cases, surgery may be needed. °Follow these instructions at home: °Managing pain, stiffness, and swelling ° °· Do all exercises and stretches as told by your doctor. °· Stand and sit up straight (use good posture). If you were given a brace or a corset, wear it as told by your doctor. °· Do not do any activities that cause pain. Ask your doctor what activities are safe for you. °· Do not lift anything that is heavier than 10 lb (4.5 kg) or heavier than your doctor tells you. °· Try to stay at a healthy weight. Talk with your doctor if you need help losing weight. °· If directed, put heat on the affected area as often as told by your doctor. Use the heat source that your doctor recommends, such as a moist heat pack or a heating pad. °? Put a towel between your skin and the heat source. °? Leave the heat on for 20-30 minutes. °? Remove the heat if your skin turns bright red. This is especially important if you are not able to feel pain, heat, or cold. You may have a greater risk of getting burned. °General instructions °· Take over-the-counter and prescription medicines only as told by your doctor. °· Do not use any products that contain nicotine or tobacco, such as cigarettes and e-cigarettes. If you need help quitting, ask your doctor. °· Eat a healthy diet. This includes plenty of fruits and vegetables, whole grains, and low-fat (lean) protein. °· Keep all follow-up visits as told by your doctor. This is important. °Contact a doctor if: °· Your symptoms do not get better. °· Your symptoms get worse. °· You have a fever. °Get help right away if: °· You have new or worse pain  in your neck or upper back. °· You have very bad pain that medicine does not control. °· You are dizzy. °· You have vision problems, blurred vision, or double vision. °· You have a very bad headache that is worse when you stand. °· You feel sick to your stomach (nauseous). °· You throw up (vomit). °· You have new or worse numbness or tingling in your back or legs. °· You have pain, redness, swelling, or warmth in your arm or leg. °Summary °· Spinal stenosis happens when the open space (spinal canal) between the bones of your spine gets smaller (narrow). °· Contact a doctor if your symptoms get worse. °· In some cases, surgery may be needed. °This information is not intended to replace advice given to you by your health care provider. Make sure you discuss any questions you have with your health care provider. °Document Released: 05/21/2010 Document Revised: 01/07/2017 Document Reviewed: 12/31/2015 °Elsevier Patient Education © 2020 Elsevier Inc. ° °

## 2018-08-22 NOTE — Progress Notes (Signed)
Subjective:  Patient ID: Zachary Hester, male    DOB: 1960/08/25  Age: 58 y.o. MRN: [id number]  CC: Back Pain   HPI Zachary Hester is a 58 year old male with a history of low back pain from spinal stenosis, hyperlipidemia who presents today for a follow-up visit. Last seen by Dr. Lorin Mercy of orthopedics for spinal stenosis and referred for MRI of the spine which he is yet to undergo. He complains of pain in his left lower extremity which is worse with walking and he does have chronic low back pain which is not as bad at this time.  Requests refill of tramadol-he is currently under a pain contract. PDMP reviewed and he received tramadol and Morphine sulfate from two different physicians in the last 2 months. Denies falls, loss of sphincteric function. For his hyperlipidemia he has not been compliant with his statin.  Past Medical History:  Diagnosis Date  . Back pain   . Chronic back pain   . HTN (hypertension)   . Hypercholesteremia     Past Surgical History:  Procedure Laterality Date  . NO PAST SURGERIES      Family History  Problem Relation Age of Onset  . Hypertension Mother   . Heart disease Sister   . Diabetes Sister     No Known Allergies  Outpatient Medications Prior to Visit  Medication Sig Dispense Refill  . Ergocalciferol (VITAMIN D2 PO) Take 1 tablet by mouth daily.    Marland Kitchen ibuprofen (ADVIL) 600 MG tablet Take 1 tablet (600 mg total) by mouth every 6 (six) hours as needed for moderate pain. (Patient not taking: Reported on 08/22/2018) 30 tablet 0  . morphine (MSIR) 15 MG tablet Take 1 tablet (15 mg total) by mouth every 6 (six) hours as needed for severe pain. (Patient not taking: Reported on 08/22/2018) 10 tablet 0  . predniSONE (DELTASONE) 10 MG tablet DAY 1: TAKE 6 TABLETS BY MOUTH WITH MEAL; DAY 2: TAKE 5 TABLETS; DAY 3 TAKE 4 TABLETS; DAY 4 TAKE 3 TABLETS; DAY 5 TAKE 2 TABLETS; DAY 6 TAKE 1 TABLET. (Patient not taking: Reported on 07/25/2018) 21 tablet 0  .  atorvastatin (LIPITOR) 20 MG tablet Take 1 tablet (20 mg total) by mouth daily. (Patient not taking: Reported on 08/22/2018) 30 tablet 6  . atorvastatin (LIPITOR) 20 MG tablet Take 1 tablet (20 mg total) by mouth daily. (Patient not taking: Reported on 08/22/2018) 60 tablet 2  . traMADol (ULTRAM) 50 MG tablet Take 1 tablet (50 mg total) by mouth every 12 (twelve) hours as needed. (Patient not taking: Reported on 08/22/2018) 60 tablet 0   No facility-administered medications prior to visit.      ROS Review of Systems  Constitutional: Negative for activity change and appetite change.  HENT: Negative for sinus pressure and sore throat.   Eyes: Negative for visual disturbance.  Respiratory: Negative for cough, chest tightness and shortness of breath.   Cardiovascular: Negative for chest pain and leg swelling.  Gastrointestinal: Negative for abdominal distention, abdominal pain, constipation and diarrhea.  Endocrine: Negative.   Genitourinary: Negative for dysuria.  Musculoskeletal:       See HPI  Skin: Negative for rash.  Allergic/Immunologic: Negative.   Neurological: Negative for weakness, light-headedness and numbness.  Psychiatric/Behavioral: Negative for dysphoric mood and suicidal ideas.    Objective:  BP (!) 147/91   Pulse 80   Ht 6\' 2"  (1.88 m)   Wt 189 lb (85.7 kg)   SpO2 99%  BMI 24.27 kg/m   BP/Weight 08/22/2018 07/29/2018 07/25/2018  Systolic BP 147 174 -  Diastolic BP 91 94 -  Wt. (Lbs) 189 192 196  BMI 24.27 25.33 25.16      Physical Exam Constitutional:      Appearance: He is well-developed.  Cardiovascular:     Rate and Rhythm: Normal rate.     Heart sounds: Normal heart sounds. No murmur.  Pulmonary:     Effort: Pulmonary effort is normal.     Breath sounds: Normal breath sounds. No wheezing or rales.  Chest:     Chest wall: No tenderness.  Abdominal:     General: Bowel sounds are normal. There is no distension.     Palpations: Abdomen is soft. There  is no mass.     Tenderness: There is no abdominal tenderness.  Musculoskeletal: Normal range of motion.  Neurological:     Mental Status: He is alert and oriented to person, place, and time.     CMP Latest Ref Rng & Units 06/21/2018 08/08/2017 06/21/2016  Glucose 65 - 99 mg/dL 92 80 161(W104(H)  BUN 6 - 24 mg/dL 12 14 12   Creatinine 0.76 - 1.27 mg/dL 9.600.76 4.54(U0.72(L) 9.810.81  Sodium 134 - 144 mmol/L 138 139 139  Potassium 3.5 - 5.2 mmol/L 4.4 4.3 4.3  Chloride 96 - 106 mmol/L 104 104 103  CO2 20 - 29 mmol/L 19(L) 22 22  Calcium 8.7 - 10.2 mg/dL 9.1 9.2 9.5  Total Protein 6.0 - 8.5 g/dL 7.0 6.9 -  Total Bilirubin 0.0 - 1.2 mg/dL 0.7 0.6 -  Alkaline Phos 39 - 117 IU/L 103 96 -  AST 0 - 40 IU/L 18 17 -  ALT 0 - 44 IU/L 17 15 -    Lipid Panel     Component Value Date/Time   CHOL 177 06/21/2018 1633   TRIG 100 06/21/2018 1633   HDL 47 06/21/2018 1633   CHOLHDL 3.8 06/21/2018 1633   CHOLHDL 5.3 Ratio 11/05/2009 2215   VLDL 30 11/05/2009 2215   LDLCALC 110 (H) 06/21/2018 1633    CBC    Component Value Date/Time   WBC 5.9 06/21/2018 1633   WBC 5.3 01/24/2014 0936   RBC 4.99 06/21/2018 1633   RBC 5.38 01/24/2014 0936   HGB 14.9 06/21/2018 1633   HCT 43.3 06/21/2018 1633   PLT 275 06/21/2018 1633   MCV 87 06/21/2018 1633   MCH 29.9 06/21/2018 1633   MCH 28.6 01/24/2014 0936   MCHC 34.4 06/21/2018 1633   MCHC 34.4 01/24/2014 0936   RDW 13.1 06/21/2018 1633   LYMPHSABS 2.1 08/08/2017 1638   MONOABS 0.4 01/24/2014 0936   EOSABS 0.4 08/08/2017 1638   BASOSABS 0.1 08/08/2017 1638    Lab Results  Component Value Date   HGBA1C 5.8 (H) 06/21/2018    Assessment & Plan:   1. Spinal stenosis at L4-L5 level Uncontrolled He has broken his pain contract and received tramadol and morphine from other physicians Informed I will no longer prescribe tramadol for him Offered to refer him to pain management however he declines Placed on gabapentin and Cymbalta He will follow-up with his back  doctor Dr. Ophelia CharterYates  2. Hypercholesteremia - atorvastatin (LIPITOR) 20 MG tablet; Take 1 tablet (20 mg total) by mouth daily.  Dispense: 30 tablet; Refill: 6    Meds ordered this encounter  Medications  . DULoxetine (CYMBALTA) 60 MG capsule    Sig: Take 1 capsule (60 mg total) by mouth daily.  Dispense:  30 capsule    Refill:  3  . gabapentin (NEURONTIN) 300 MG capsule    Sig: Take 1 capsule (300 mg total) by mouth at bedtime.    Dispense:  30 capsule    Refill:  3  . atorvastatin (LIPITOR) 20 MG tablet    Sig: Take 1 tablet (20 mg total) by mouth daily.    Dispense:  30 tablet    Refill:  6    Follow-up: Return in about 6 months (around 02/22/2019) for hypercholesterolemia.       Hoy RegisterEnobong Hollynn Garno, MD, FAAFP. [facility] [place], [place] [phone]   08/22/2018, 2:14 PM

## 2018-08-23 ENCOUNTER — Telehealth: Payer: Self-pay | Admitting: Radiology

## 2018-08-23 NOTE — Telephone Encounter (Signed)
I called and lmom that it looks like [facility] has been trying to call him to schedule his MRI --I left him their phone number [phone] to call them back to sched the appt.

## 2018-11-14 ENCOUNTER — Other Ambulatory Visit: Payer: Self-pay

## 2018-11-14 ENCOUNTER — Ambulatory Visit: Payer: BC Managed Care – PPO | Admitting: Podiatry

## 2018-11-14 ENCOUNTER — Encounter: Payer: Self-pay | Admitting: Podiatry

## 2018-11-14 ENCOUNTER — Ambulatory Visit (INDEPENDENT_AMBULATORY_CARE_PROVIDER_SITE_OTHER): Payer: BC Managed Care – PPO

## 2018-11-14 VITALS — BP 133/84 | HR 106 | Resp 16

## 2018-11-14 DIAGNOSIS — S92355A Nondisplaced fracture of fifth metatarsal bone, left foot, initial encounter for closed fracture: Secondary | ICD-10-CM

## 2018-11-15 ENCOUNTER — Telehealth: Payer: Self-pay | Admitting: *Deleted

## 2018-11-15 NOTE — Progress Notes (Signed)
Subjective:  Patient ID: Zachary Hester, male    DOB: 10/26/1960,  MRN: [id number] HPI Chief Complaint  Patient presents with  . Foot Pain    Lateral foot left - twisted foot in late June 2020, went to doc-xrayed-fracture 5th met, wearing boot since initial injury, pain is intermittent  . New Patient (Initial Visit)  He injured this in late June 2020 as he refers to the left foot.  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  He was placed in a cam walker was told that his foot was broken the day of his injury.  He has been to at least 2 doctors since that time.  He states that the foot is still very sore he is tried wearing his cam walker regularly and to no avail.  58 y.o. male presents with the above complaint.   ROS: He denies fever chills nausea vomiting muscle aches pains calf pain back pain chest pain shortness of breath.  Past Medical History:  Diagnosis Date  . Back pain   . Chronic back pain   . HTN (hypertension)   . Hypercholesteremia    Past Surgical History:  Procedure Laterality Date  . NO PAST SURGERIES      Current Outpatient Medications:  .  atorvastatin (LIPITOR) 20 MG tablet, Take 1 tablet (20 mg total) by mouth daily., Disp: 30 tablet, Rfl: 6 .  DULoxetine (CYMBALTA) 60 MG capsule, Take 1 capsule (60 mg total) by mouth daily., Disp: 30 capsule, Rfl: 3 .  Ergocalciferol (VITAMIN D2 PO), Take 1 tablet by mouth daily., Disp: , Rfl:  .  gabapentin (NEURONTIN) 300 MG capsule, Take 1 capsule (300 mg total) by mouth at bedtime., Disp: 30 capsule, Rfl: 3  No Known Allergies Review of Systems Objective:   Vitals:   11/14/18 1336  BP: 133/84  Pulse: (!) 106  Resp: 16    General: Well developed, nourished, in no acute distress, alert and oriented x3   Dermatological: Skin is warm, dry and supple bilateral. Nails x 10 are well maintained; remaining  integument appears unremarkable at this time. There are no open sores, no preulcerative lesions, no rash or signs of infection present.  Vascular: Dorsalis Pedis artery and Posterior Tibial artery pedal pulses are 2/4 bilateral with immedate capillary fill time. Pedal hair growth present. No varicosities and no lower extremity edema present bilateral.   Neruologic: Grossly intact via light touch bilateral. Vibratory intact via tuning fork bilateral. Protective threshold with Semmes Wienstein monofilament intact to all pedal sites bilateral. Patellar and Achilles deep tendon reflexes 2+ bilateral. No Babinski or clonus noted bilateral.   Musculoskeletal: No gross boney pedal deformities bilateral. No pain, crepitus, or limitation noted with foot and ankle range of motion bilateral. Muscular strength 5/5 in all groups tested bilateral.  Swelling and pain is noted overlying the fifth metatarsal base of the left foot there is no ecchymosis.  He has pain on palpation of this area. Gait: Unassisted, Nonantalgic.    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  Assessment & Plan:   Assessment: Nonunion Jones fracture left.  Plan: I highly recommended continued use of the cam walker nonweightbearing status and  continue out of work until we can get this to heal.  He will more than likely need at least 8 weeks before some consolidation would take place this would be once we get a bone stimulator.  Otherwise this is well have to be an open repair.  We will put in for a Exogen bone stimulator and assigned the appropriate paperwork.      T. [place], [place]

## 2018-11-15 NOTE — Telephone Encounter (Signed)
-----  Message from Ashley E Prevette, PMAC sent at 11/14/2018  1:50 PM EDT ----- Regarding: Bone Stim-Trey Contact Trey for bone stimulator - 5th met fracture left  

## 2018-11-15 NOTE — Telephone Encounter (Signed)
I informed [facility] - T. Nicole Kindred of Dr. Stephenie Acres orders for the Exogen Bone Growth Stimulator. Clinicals from Dr. Milinda Pointer and pt's previous doctor and x-ray CDs, demographics and prescription are prepared for T. Nicole Kindred to pick up today.

## 2018-11-17 ENCOUNTER — Telehealth: Payer: Self-pay | Admitting: Podiatry

## 2018-11-17 NOTE — Telephone Encounter (Signed)
Patient was seen on 11/14/2018 by Dr. Milinda Pointer who stated patient would benefit from being out of work. He came into the office this afternoon saying that he is requesting a letter to return back to work. I let the patient know that per Dr. Eugenie Norrie last OV note he did not recommend that so I would not write the letter. Please advise.Marland KitchenMarland Kitchen

## 2018-11-20 ENCOUNTER — Telehealth: Payer: Self-pay | Admitting: Podiatry

## 2018-11-20 ENCOUNTER — Ambulatory Visit: Payer: BC Managed Care – PPO | Admitting: Podiatry

## 2018-11-20 NOTE — Telephone Encounter (Signed)
I informed pt that Dr. Milinda Pointer wanted him to be off the foot 8 weeks starting 11/14/2018 and he would need to be reevaluated prior to return to work. Pt states he has short-term disability paperwork and he would need to discuss with her.

## 2018-11-20 NOTE — Telephone Encounter (Signed)
He could go back NON [employer] after two weeks but ABSOLUTELY not walking on it.

## 2018-11-20 NOTE — Telephone Encounter (Signed)
Pt has called back and needing a work note to go back to work asap.

## 2018-11-20 NOTE — Telephone Encounter (Addendum)
I spoke with pt previously and informed that Dr. Milinda Pointer had estimated he would be out of work at least 8 weeks from 11/14/2018 and he began to talk about short-term disability and I told him I would transfer to one of our other staff to assist. I called pt and asked if he would be able to perform seated duties for his whole shift and he said he would. I told pt I would inform Dr. Milinda Pointer and the short-term disability agents and call again at a later time.

## 2018-11-21 NOTE — Telephone Encounter (Signed)
Pt called and I informed of Dr. Stephenie Acres statement and pt asked when his disability would begin. I told pt that I would have J. Albert contact him to discuss.

## 2018-11-21 NOTE — Telephone Encounter (Signed)
Left message informing pt of Dr. Stephenie Acres 11/20/2018 4:48pm orders and that he could go back to work 11/28/2018 with NO weightbearing at all and could use crutches prescribed 07/2018, and to call me.

## 2018-11-23 ENCOUNTER — Encounter: Payer: Self-pay | Admitting: Podiatry

## 2019-02-07 ENCOUNTER — Ambulatory Visit: Payer: BC Managed Care – PPO | Admitting: Podiatry

## 2019-02-13 ENCOUNTER — Ambulatory Visit: Payer: BC Managed Care – PPO | Admitting: Podiatry

## 2019-02-13 ENCOUNTER — Ambulatory Visit (INDEPENDENT_AMBULATORY_CARE_PROVIDER_SITE_OTHER): Payer: BC Managed Care – PPO

## 2019-02-13 ENCOUNTER — Encounter: Payer: Self-pay | Admitting: Podiatry

## 2019-02-13 ENCOUNTER — Other Ambulatory Visit: Payer: Self-pay

## 2019-02-13 DIAGNOSIS — S92355D Nondisplaced fracture of fifth metatarsal bone, left foot, subsequent encounter for fracture with routine healing: Secondary | ICD-10-CM | POA: Diagnosis not present

## 2019-02-14 ENCOUNTER — Telehealth: Payer: Self-pay | Admitting: *Deleted

## 2019-02-14 DIAGNOSIS — S92355D Nondisplaced fracture of fifth metatarsal bone, left foot, subsequent encounter for fracture with routine healing: Secondary | ICD-10-CM

## 2019-02-14 NOTE — Progress Notes (Addendum)
He presents today for follow-up of a fracture to the fifth metatarsal base.  He states that he did this while in the building at work back in June but insurance/Worker's Comp. would not cover it.  He states that it seems to be doing better than it was though it still hurts.  Objective: Vital signs are stable he is alert and oriented x3.  Pulses are palpable.  He still has edema overlying the dorsal lateral aspect of fifth metatarsal area of the left foot with tenderness on palpation.  Radiographs taken today demonstrate a diastases between the bone fragments appears to be nondisplaced noncomminuted there is some osseous bridging but that this is obviously a delayed/nonunion at this point.  It appears to be a 3-4 mm fracture gap.  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  Plan: We are going to request an Exogen bone stimulator.  Hopefully this will take care of the fracture itself without surgery.  Otherwise he will have to go to surgery and be out of work.

## 2019-02-14 NOTE — Telephone Encounter (Signed)
Please advise 

## 2019-02-14 NOTE — Telephone Encounter (Signed)
-----  Message from Rip Harbour, [facility] sent at 02/13/2019  4:10 PM EST ----- Regarding: Bone Stim Donley Redder for Bone Stimulator - injury/fracture 5th met left June 2020, delayed/nonunion

## 2019-02-15 NOTE — Telephone Encounter (Signed)
Please make addition to note that it appears to be a three for four millimeter fracture gap. Thanks

## 2019-02-15 NOTE — Telephone Encounter (Signed)
Chart note has been addended.

## 2019-02-15 NOTE — Telephone Encounter (Signed)
Left message informing [facility] Rose[facility] Reno of Dr. Geryl Rankins orders. Prepared orders, demographics and clinicals 02/13/2019, 11/14/2018 and including ED visit 07/29/2018 for pick up [facility] Ren[facility].

## 2019-02-19 ENCOUNTER — Ambulatory Visit: Payer: BC Managed Care – PPO | Attending: Internal Medicine

## 2019-02-19 DIAGNOSIS — Z20822 Contact with and (suspected) exposure to covid-19: Secondary | ICD-10-CM

## 2019-02-21 LAB — NOVEL CORONAVIRUS, NAA: SARS-CoV-2, NAA: DETECTED — AB

## 2019-02-21 NOTE — Progress Notes (Signed)
Your recent test for the COVID virus has returned as positive. Recommendations for treatment are to treat symptoms as needed with over the counter medications, contact your primary care doctor for follow up and go to hospital for trouble breathing ( short of breath sitting still, can't get enough air in lungs), dehydration symptoms or severe weakness needing help to walk. CDC recommendations for isolation are: isolate away from others, wear mask, wash hands and hard surfaces frequently. Criteria for ending isolation: must be minimum of 10 days from symptom onset, without fever for 3 days- without treatment for fever, and improvement/resolution of symptoms. Please call [phone] to speak to a [facility] nurse if you have any questions about the information provided.

## 2019-02-22 ENCOUNTER — Telehealth: Payer: Self-pay | Admitting: Nurse Practitioner

## 2019-02-22 NOTE — Telephone Encounter (Signed)
Called to Discuss with patient about Covid symptoms and the use of bamlanivimab, a monoclonal antibody infusion for those with mild to moderate Covid symptoms and at a high risk of hospitalization.     Pt is qualified for this infusion at the [facility] infusion center due to co-morbid conditions and/or a member of an at-risk group.     Patient Active Problem List   Diagnosis Date Noted  . Chronic pain syndrome 09/23/2017  . Spinal stenosis at L4-L5 level 06/27/2017  . Chronic bilateral low back pain with left-sided sciatica 06/28/2016  . Hypercholesteremia 06/21/2016  . Vitamin D deficiency 06/21/2016  . Heartburn 06/21/2016  . Heme positive stool 06/15/2016  . Generalized abdominal pain 06/15/2016  . Chronic back pain    Patient has mild symptoms that started last Saturday and he feels that they are gradually improving. He declines infusion at this time. Symptoms tier reviewed as well as criteria for ending isolation. Preventative practices reviewed. Patient verbalized understanding.   Patient advised to call back if he decides that he does want to get infusion. Callback number to the infusion center given. Patient advised to go to Urgent care or ED with severe symptoms.

## 2019-12-11 ENCOUNTER — Telehealth: Payer: Self-pay | Admitting: Family Medicine

## 2019-12-11 DIAGNOSIS — E7800 Pure hypercholesterolemia, unspecified: Secondary | ICD-10-CM

## 2019-12-11 NOTE — Telephone Encounter (Signed)
Order has been placed.

## 2019-12-11 NOTE — Telephone Encounter (Signed)
Will route to PCP for review. 

## 2019-12-11 NOTE — Addendum Note (Signed)
Addended by: Hoy Register on: 12/11/2019 07:03 PM   Modules accepted: Orders

## 2019-12-11 NOTE — Telephone Encounter (Signed)
Copied from CRM [id number]. Topic: General - Other >> Dec 11, 2019  1:52 PM Dalphine Handing A wrote: Patient is requesting to have an order placed to check his cholesterol. Please advise

## 2019-12-12 NOTE — Telephone Encounter (Signed)
Patient was called and informed to contact office to set up a lab appointment.

## 2019-12-14 ENCOUNTER — Other Ambulatory Visit: Payer: BC Managed Care – PPO

## 2019-12-14 ENCOUNTER — Telehealth: Payer: Self-pay | Admitting: Family Medicine

## 2019-12-14 NOTE — Telephone Encounter (Signed)
Pt brought Disability placard application. Call pt [phone] when ready. Placed in provider bin.

## 2019-12-17 ENCOUNTER — Other Ambulatory Visit: Payer: BC Managed Care – PPO

## 2019-12-21 ENCOUNTER — Other Ambulatory Visit: Payer: BC Managed Care – PPO

## 2019-12-21 ENCOUNTER — Ambulatory Visit: Payer: BC Managed Care – PPO | Attending: Family Medicine

## 2019-12-21 ENCOUNTER — Other Ambulatory Visit: Payer: Self-pay

## 2019-12-21 ENCOUNTER — Telehealth: Payer: Self-pay

## 2019-12-21 DIAGNOSIS — E7800 Pure hypercholesterolemia, unspecified: Secondary | ICD-10-CM

## 2019-12-21 NOTE — Telephone Encounter (Signed)
Patient was called and a voicemail was left informing patient that form is ready for pick up.  Form has been placed upfront for pick up. 

## 2019-12-22 LAB — LIPID PANEL
Chol/HDL Ratio: 4.6 ratio (ref 0.0–5.0)
Cholesterol, Total: 207 mg/dL — ABNORMAL HIGH (ref 100–199)
HDL: 45 mg/dL (ref >39)
LDL Chol Calc (NIH): 148 mg/dL — ABNORMAL HIGH (ref 0–99)
Triglycerides: 75 mg/dL (ref 0–149)
VLDL Cholesterol Cal: 14 mg/dL (ref 5–40)

## 2019-12-22 LAB — CMP14+EGFR
ALT: 15 IU/L (ref 0–44)
AST: 17 IU/L (ref 0–40)
Albumin/Globulin Ratio: 1.4 (ref 1.2–2.2)
Albumin: 4 g/dL (ref 3.8–4.9)
Alkaline Phosphatase: 113 IU/L (ref 44–121)
BUN/Creatinine Ratio: 14 (ref 9–20)
BUN: 12 mg/dL (ref 6–24)
Bilirubin Total: 0.9 mg/dL (ref 0.0–1.2)
CO2: 21 mmol/L (ref 20–29)
Calcium: 9.4 mg/dL (ref 8.7–10.2)
Chloride: 105 mmol/L (ref 96–106)
Creatinine, Ser: 0.86 mg/dL (ref 0.76–1.27)
GFR calc Af Amer: 110 mL/min/{1.73_m2} (ref >59)
GFR calc non Af Amer: 95 mL/min/{1.73_m2} (ref >59)
Globulin, Total: 2.9 g/dL (ref 1.5–4.5)
Glucose: 81 mg/dL (ref 65–99)
Potassium: 4.2 mmol/L (ref 3.5–5.2)
Sodium: 140 mmol/L (ref 134–144)
Total Protein: 6.9 g/dL (ref 6.0–8.5)

## 2019-12-24 ENCOUNTER — Other Ambulatory Visit: Payer: Self-pay | Admitting: Family Medicine

## 2019-12-24 DIAGNOSIS — E7800 Pure hypercholesterolemia, unspecified: Secondary | ICD-10-CM

## 2019-12-24 MED ORDER — ATORVASTATIN CALCIUM 40 MG PO TABS: 40.0000 mg | ORAL_TABLET | Freq: Every day | ORAL | 1 refills | Status: DC

## 2019-12-28 ENCOUNTER — Telehealth: Payer: Self-pay

## 2019-12-28 NOTE — Telephone Encounter (Signed)
Patient was called and voicemail is currently full. 

## 2019-12-28 NOTE — Telephone Encounter (Signed)
-----   Message from Enobong Newlin, MD sent at 12/24/2019  5:45 PM EST ----- °Please inform him his cholesterol is elevated and I have sent an increased dose of Lipitor to his pharmacy.  Please advised to adhere to a low-cholesterol diet. °

## 2020-01-02 ENCOUNTER — Telehealth: Payer: Self-pay

## 2020-01-02 NOTE — Telephone Encounter (Signed)
Letter will be mailed to pt to call office for results.

## 2020-01-02 NOTE — Telephone Encounter (Signed)
-----   Message from Hoy Register, MD sent at 12/24/2019  5:45 PM EST ----- Please inform him his cholesterol is elevated and I have sent an increased dose of Lipitor to his pharmacy.  Please advised to adhere to a low-cholesterol diet.

## 2020-01-11 NOTE — Telephone Encounter (Signed)
Pt. Given results and instructions. Verbalizes understanding. 

## 2020-02-10 ENCOUNTER — Ambulatory Visit (HOSPITAL_COMMUNITY)
Admission: EM | Admit: 2020-02-10 | Discharge: 2020-02-10 | Disposition: A | Discharge status: Home / Self Care | Payer: BC Managed Care – PPO | Attending: Internal Medicine | Admitting: Internal Medicine

## 2020-02-10 ENCOUNTER — Other Ambulatory Visit: Payer: Self-pay

## 2020-02-10 ENCOUNTER — Encounter (HOSPITAL_COMMUNITY): Payer: Self-pay | Admitting: Emergency Medicine

## 2020-02-10 DIAGNOSIS — Z20822 Contact with and (suspected) exposure to covid-19: Secondary | ICD-10-CM | POA: Insufficient documentation

## 2020-02-10 NOTE — ED Triage Notes (Signed)
PT C/O: cold sx onset 1 days associated w/sore throat and sneezing  DENIES: f/vn/d  TAKING MEDS: none  A&O x4... NAD... Ambulatory

## 2020-02-10 NOTE — ED Notes (Signed)
Pt wants a COVID test, states do not want to be seeing by provider.

## 2020-02-11 LAB — SARS CORONAVIRUS 2 (TAT 6-24 HRS): SARS Coronavirus 2: NEGATIVE

## 2020-07-31 ENCOUNTER — Other Ambulatory Visit: Payer: Self-pay | Admitting: Family Medicine

## 2020-07-31 DIAGNOSIS — E7800 Pure hypercholesterolemia, unspecified: Secondary | ICD-10-CM

## 2020-07-31 NOTE — Telephone Encounter (Signed)
Requested medication (s) are due for refill today: no   Requested medication (s) are on the active medication list: yes  Last refill: 06/27/2020  Future visit scheduled:no   Notes to clinic: overdue for labs and follow up    Requested Prescriptions  Pending Prescriptions Disp Refills   atorvastatin (LIPITOR) 40 MG tablet [Pharmacy Med Name: ATORVASTATIN 40MG  TABLETS] 150 tablet     Sig: TAKE ONE TABLET BY MOUTH DAILY      Cardiovascular:  Antilipid - Statins Failed - 07/31/2020  3:46 AM      Failed - Total Cholesterol in normal range and within 360 days    Cholesterol, Total  Date Value Ref Range Status  12/21/2019 207 (H) 100 - 199 mg/dL Final          Failed - LDL in normal range and within 360 days    LDL Chol Calc (NIH)  Date Value Ref Range Status  12/21/2019 148 (H) 0 - 99 mg/dL Final          Failed - Valid encounter within last 12 months    Recent Outpatient Visits           1 year ago Spinal stenosis at L4-L5 level   [facility], MD   2 years ago Encounter to establish care   [facility], Shelbie Ammons, NP   2 years ago Hypercholesteremia   [facility] [place], [place], MD   2 years ago Chronic bilateral low back pain with left-sided sciatica   [facility] [place], [place], MD   2 years ago Routine general medical examination at a health care facility   [facility] [place], [place], MD                Passed - HDL in normal range and within 360 days    HDL  Date Value Ref Range Status  12/21/2019 45 >39 mg/dL Final          Passed - Triglycerides in normal range and within 360 days    Triglycerides  Date Value Ref Range Status  12/21/2019 75 0 - 149 mg/dL Final          Passed - Patient is not pregnant

## 2020-08-14 ENCOUNTER — Ambulatory Visit: Payer: BC Managed Care – PPO | Attending: Internal Medicine | Admitting: Internal Medicine

## 2020-08-14 ENCOUNTER — Other Ambulatory Visit: Payer: Self-pay

## 2020-08-14 VITALS — BP 128/77 | HR 71 | Resp 16 | Ht 73.0 in | Wt 193.2 lb

## 2020-08-14 DIAGNOSIS — D649 Anemia, unspecified: Secondary | ICD-10-CM | POA: Diagnosis not present

## 2020-08-14 DIAGNOSIS — E7800 Pure hypercholesterolemia, unspecified: Secondary | ICD-10-CM

## 2020-08-14 DIAGNOSIS — Z131 Encounter for screening for diabetes mellitus: Secondary | ICD-10-CM

## 2020-08-14 MED ORDER — ATORVASTATIN CALCIUM 40 MG PO TABS: 40.0000 mg | ORAL_TABLET | Freq: Every day | ORAL | 1 refills | Status: DC

## 2020-08-14 NOTE — Progress Notes (Addendum)
Patient ID: Zachary Hester, male    DOB: 06-12-1960  MRN: [id number]  CC: Medication Refill   Subjective: Zachary Hester is a 60 y.o. male who presents for med RF.  PCP is Dr. Alvis Lemmings. His concerns today include:  Patient with history of HL, spinal stenosis, chronic pain syndrome,  HL:  needs RF on Lipitor.  Out x 5 days. 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  Wants to be screened for DM.  Siblings have DM.  Denies any polyuria or polydipsia.  No blurred vision.  Patient Active Problem List   Diagnosis Date Noted   Chronic pain syndrome 09/23/2017   Spinal stenosis at L4-L5 level 06/27/2017   Chronic bilateral low back pain with left-sided sciatica 06/28/2016   Hypercholesteremia 06/21/2016   Vitamin D deficiency 06/21/2016   Heartburn 06/21/2016   Heme positive stool 06/15/2016   Generalized abdominal pain 06/15/2016   Chronic back pain      Current Outpatient Medications on File Prior to Visit  Medication Sig Dispense Refill   atorvastatin (LIPITOR) 40 MG tablet Take 1 tablet (40 mg total) by mouth daily. 90 tablet 1   DULoxetine (CYMBALTA) 60 MG capsule Take 1 capsule (60 mg total) by mouth daily. (Patient not taking: Reported on 08/14/2020) 30 capsule 3   Ergocalciferol (VITAMIN D2 PO) Take 1 tablet by mouth daily. (Patient not taking: Reported on 08/14/2020)     gabapentin (NEURONTIN) 300 MG capsule Take 1 capsule (300 mg total) by mouth at bedtime. (Patient not taking: Reported on 08/14/2020) 30 capsule 3   No current facility-administered medications on file prior to visit.    No Known Allergies  Social History   Socioeconomic History   Marital status: Married    Spouse name: Not on file   Number of children: 6   Years of education: Not on file   Highest education level: Not on file  Occupational History   Occupation: [employer]  Tobacco Use   Smoking status: Never   Smokeless tobacco: Never  Vaping Use   Vaping Use:  Never used  Substance and Sexual Activity   Alcohol use: No   Drug use: No   Sexual activity: Yes  Other Topics Concern   Not on file  Social History Narrative   Not on file   Social Determinants of Health   Financial Resource Strain: Not on file  Food Insecurity: Not on file  Transportation Needs: Not on file  Physical Activity: Not on file  Stress: Not on file  Social Connections: Not on file  Intimate Partner Violence: Not on file    Family History  Problem Relation Age of Onset   Hypertension Mother    Heart disease Sister    Diabetes Sister     Past Surgical History:  Procedure Laterality Date   NO PAST SURGERIES      ROS: Review of Systems Negative except as stated above  PHYSICAL EXAM: BP 128/77   Pulse 71   Resp 16   Ht 6\' 1"  (1.854 m)   Wt 193 lb 3.2 oz (87.6 kg)   SpO2 99%   BMI 25.49 kg/m   Physical Exam  General appearance - alert, well appearing, older male and in no distress Mental status - normal mood, behavior, speech, dress, motor activity, and thought processes Chest - clear to auscultation, no wheezes, rales or rhonchi, symmetric air entry Heart - normal rate, regular rhythm, normal S1, S2, no murmurs,  rubs, clicks or gallops Extremities -no lower extremity edema  CMP Latest Ref Rng & Units 12/21/2019 06/21/2018 08/08/2017  Glucose 65 - 99 mg/dL 81 92 80  BUN 6 - 24 mg/dL 12 12 14   Creatinine 0.76 - 1.27 mg/dL 6.72 0.94)  Sodium 134 - 144 mmol/L 140 138 139  Potassium 3.5 - 5.2 mmol/L 4.2 4.4 4.3  Chloride 96 - 106 mmol/L 105 104 104  CO2 20 - 29 mmol/L 21 19(L) 22  Calcium 8.7 - 10.2 mg/dL 9.4 9.1 9.2  Total Protein 6.0 - 8.5 g/dL 6.9 7.0 6.9  Total Bilirubin 0.0 - 1.2 mg/dL 0.9 0.7 0.6  Alkaline Phos 44 - 121 IU/L 113 103 96  AST 0 - 40 IU/L 17 18 17   ALT 0 - 44 IU/L 15 17 15    Lipid Panel     Component Value Date/Time   CHOL 207 (H) 12/21/2019 1527   TRIG 75 12/21/2019 1527   HDL 45 12/21/2019 1527   CHOLHDL 4.6  12/21/2019 1527   CHOLHDL 5.3 Ratio 11/05/2009 2215   VLDL 30 11/05/2009 2215   LDLCALC 148 (H) 12/21/2019 1527    CBC    Component Value Date/Time   WBC 5.9 06/21/2018 1633   WBC 5.3 01/24/2014 0936   RBC 4.99 06/21/2018 1633   RBC 5.38 01/24/2014 0936   HGB 14.9 06/21/2018 1633   HCT 43.3 06/21/2018 1633   PLT 275 06/21/2018 1633   MCV 87 06/21/2018 1633   MCH 29.9 06/21/2018 1633   MCH 28.6 01/24/2014 0936   MCHC 34.4 06/21/2018 1633   MCHC 34.4 01/24/2014 0936   RDW 13.1 06/21/2018 1633   LYMPHSABS 2.1 08/08/2017 1638   MONOABS 0.4 01/24/2014 0936   EOSABS 0.4 08/08/2017 1638   BASOSABS 0.1 08/08/2017 1638    ASSESSMENT AND PLAN: 1. Hypercholesteremia - atorvastatin (LIPITOR) 40 MG tablet; Take 1 tablet (40 mg total) by mouth daily.  Dispense: 90 tablet; Refill: 1 - CBC - Comprehensive metabolic panel - Lipid panel  2. Diabetes mellitus screening - Hemoglobin A1c   Patient was given the opportunity to ask questions.  Patient verbalized understanding of the plan and was able to repeat key elements of the plan.   No orders of the defined types were placed in this encounter.   Addendum 08/15/2020: Patient with significant anemia and thrombocytosis on CBC.  We will add iron studies, folic acid and vitamin B12 levels.  Requested Prescriptions    No prescriptions requested or ordered in this encounter    No follow-ups on file.  10/09/2017, MD, FACP

## 2020-08-15 ENCOUNTER — Telehealth: Payer: Self-pay | Admitting: Internal Medicine

## 2020-08-15 DIAGNOSIS — E875 Hyperkalemia: Secondary | ICD-10-CM

## 2020-08-15 DIAGNOSIS — R945 Abnormal results of liver function studies: Secondary | ICD-10-CM

## 2020-08-15 DIAGNOSIS — R7989 Other specified abnormal findings of blood chemistry: Secondary | ICD-10-CM

## 2020-08-15 LAB — LIPID PANEL
Chol/HDL Ratio: 4.8 ratio (ref 0.0–5.0)
Cholesterol, Total: 125 mg/dL (ref 100–199)
HDL: 26 mg/dL — ABNORMAL LOW (ref >39)
LDL Chol Calc (NIH): 82 mg/dL (ref 0–99)
Triglycerides: 89 mg/dL (ref 0–149)
VLDL Cholesterol Cal: 17 mg/dL (ref 5–40)

## 2020-08-15 LAB — CBC
Hematocrit: 31.1 % — ABNORMAL LOW (ref 37.5–51.0)
Hemoglobin: 9.4 g/dL — ABNORMAL LOW (ref 13.0–17.7)
MCH: 26.5 pg — ABNORMAL LOW (ref 26.6–33.0)
MCHC: 30.2 g/dL — ABNORMAL LOW (ref 31.5–35.7)
MCV: 88 fL (ref 79–97)
Platelets: 726 10*3/uL — ABNORMAL HIGH (ref 150–450)
RBC: 3.55 x10E6/uL — ABNORMAL LOW (ref 4.14–5.80)
RDW: 14.1 % (ref 11.6–15.4)
WBC: 10.1 10*3/uL (ref 3.4–10.8)

## 2020-08-15 LAB — COMPREHENSIVE METABOLIC PANEL
ALT: 23 IU/L (ref 0–44)
AST: 47 IU/L — ABNORMAL HIGH (ref 0–40)
Albumin/Globulin Ratio: 0.9 — ABNORMAL LOW (ref 1.2–2.2)
Albumin: 3.9 g/dL (ref 3.8–4.9)
Alkaline Phosphatase: 229 IU/L — ABNORMAL HIGH (ref 44–121)
BUN/Creatinine Ratio: 16 (ref 10–24)
BUN: 10 mg/dL (ref 8–27)
Bilirubin Total: 0.6 mg/dL (ref 0.0–1.2)
CO2: 19 mmol/L — ABNORMAL LOW (ref 20–29)
Calcium: 9.6 mg/dL (ref 8.6–10.2)
Chloride: 95 mmol/L — ABNORMAL LOW (ref 96–106)
Creatinine, Ser: 0.63 mg/dL — ABNORMAL LOW (ref 0.76–1.27)
Globulin, Total: 4.3 g/dL (ref 1.5–4.5)
Glucose: 69 mg/dL (ref 65–99)
Potassium: 5.5 mmol/L — ABNORMAL HIGH (ref 3.5–5.2)
Sodium: 133 mmol/L — ABNORMAL LOW (ref 134–144)
Total Protein: 8.2 g/dL (ref 6.0–8.5)
eGFR: 109 mL/min/{1.73_m2} (ref >59)

## 2020-08-15 LAB — HEMOGLOBIN A1C
Est. average glucose Bld gHb Est-mCnc: 117 mg/dL
Hgb A1c MFr Bld: 5.7 % — ABNORMAL HIGH (ref 4.8–5.6)

## 2020-08-15 NOTE — Telephone Encounter (Signed)
Phone call placed to patient this morning to go over lab results.  I used 2 patient identifiers to make sure I am speaking with the patient. Patient informed that he has a new anemia which is concerning.  Patient denies any dizziness, weakness, abdominal pain, blood in the stools or black stools.  He had a colonoscopy in 2018 which was normal.  I told him that I have added iron studies along with vitamin B12 and folate levels to the blood that was drawn yesterday.  I will get back to him once I have the results.  If it is iron deficiency anemia, I told him that we will be referring him to the gastroenterologist as he may need an endoscopy.  He also has elevated platelet count but this can be seen in severe iron deficiency.  2.  He has prediabetes.  Dietary counseling given to help prevent progression to diabetes. 3.  He has elevation in alkaline phosphatase and AST.  This may be due to atorvastatin.  However I will add antimitochondrial antibody test.  Advised to hold off on restarting the atorvastatin.  Return to the lab in 2 weeks to have liver function test rechecked. 4.  Potassium level elevated.  This can be seen if the blood sits too long before it is ran.  Otherwise it can be a true elevation.  I recommend cutting back on potassium rich foods and I gave him the names of potassium rich foods to cut back on.  Request that he returns to the lab early next week to have potassium level rechecked.  All questions were answered.  Results for orders placed or performed in visit on 08/14/20  CBC  Result Value Ref Range   WBC 10.1 3.4 - 10.8 x10E3/uL   RBC 3.55 (L) 4.14 - 5.80 x10E6/uL   Hemoglobin 9.4 (L) 13.0 - 17.7 g/dL   Hematocrit 31.1 (L) 37.5 - 51.0 %   MCV 88 79 - 97 fL   MCH 26.5 (L) 26.6 - 33.0 pg   MCHC 30.2 (L) 31.5 - 35.7 g/dL   RDW 14.1 11.6 - 15.4 %   Platelets 726 (H) 150 - 450 x10E3/uL  Comprehensive metabolic panel  Result Value Ref Range   Glucose 69 65 - 99 mg/dL   BUN 10 8 -  27 mg/dL   Creatinine, Ser 0.63 (L) 0.76 - 1.27 mg/dL   eGFR 109 >59 mL/min/1.73   BUN/Creatinine Ratio 16 10 - 24   Sodium 133 (L) 134 - 144 mmol/L   Potassium 5.5 (H) 3.5 - 5.2 mmol/L   Chloride 95 (L) 96 - 106 mmol/L   CO2 19 (L) 20 - 29 mmol/L   Calcium 9.6 8.6 - 10.2 mg/dL   Total Protein 8.2 6.0 - 8.5 g/dL   Albumin 3.9 3.8 - 4.9 g/dL   Globulin, Total 4.3 1.5 - 4.5 g/dL   Albumin/Globulin Ratio 0.9 (L) 1.2 - 2.2   Bilirubin Total 0.6 0.0 - 1.2 mg/dL   Alkaline Phosphatase 229 (H) 44 - 121 IU/L   AST 47 (H) 0 - 40 IU/L   ALT 23 0 - 44 IU/L  Lipid panel  Result Value Ref Range   Cholesterol, Total 125 100 - 199 mg/dL   Triglycerides 89 0 - 149 mg/dL   HDL 26 (L) >39 mg/dL   VLDL Cholesterol Cal 17 5 - 40 mg/dL   LDL Chol Calc (NIH) 82 0 - 99 mg/dL   Chol/HDL Ratio 4.8 0.0 - 5.0 ratio  Hemoglobin A1c  Result Value Ref Range   Hgb A1c MFr Bld 5.7 (H) 4.8 - 5.6 %   Est. average glucose Bld gHb Est-mCnc 117 mg/dL

## 2020-08-15 NOTE — Addendum Note (Signed)
Addended by: Jonah Blue B on: 08/15/2020 07:57 AM   Modules accepted: Orders

## 2020-08-18 ENCOUNTER — Telehealth: Payer: Self-pay | Admitting: Family Medicine

## 2020-08-18 NOTE — Telephone Encounter (Signed)
Called patient and LVM to return call and schedule a follow up appointment in 5 month with PCP. If patient calls back please schedule appointment.

## 2020-08-18 NOTE — Telephone Encounter (Signed)
-----   Message from Deborah B Johnson, MD sent at 08/14/2020  5:26 PM EDT ----- Needs follow-up appointment with Dr. Newlin in 5 months.  

## 2020-08-19 ENCOUNTER — Telehealth: Payer: Self-pay | Admitting: Family Medicine

## 2020-08-19 ENCOUNTER — Encounter: Payer: Self-pay | Admitting: Internal Medicine

## 2020-08-19 ENCOUNTER — Other Ambulatory Visit: Payer: Self-pay | Admitting: Internal Medicine

## 2020-08-19 DIAGNOSIS — D649 Anemia, unspecified: Secondary | ICD-10-CM | POA: Insufficient documentation

## 2020-08-19 DIAGNOSIS — D75839 Thrombocytosis, unspecified: Secondary | ICD-10-CM

## 2020-08-19 DIAGNOSIS — R7303 Prediabetes: Secondary | ICD-10-CM | POA: Insufficient documentation

## 2020-08-19 LAB — IRON,TIBC AND FERRITIN PANEL
Ferritin: 127 ng/mL (ref 30–400)
Iron Saturation: 10 % — ABNORMAL LOW (ref 15–55)
Iron: 27 ug/dL — ABNORMAL LOW (ref 38–169)
Total Iron Binding Capacity: 266 ug/dL (ref 250–450)
UIBC: 239 ug/dL (ref 111–343)

## 2020-08-19 LAB — VITAMIN B12: Vitamin B-12: 1273 pg/mL — ABNORMAL HIGH (ref 232–1245)

## 2020-08-19 LAB — SPECIMEN STATUS REPORT

## 2020-08-19 LAB — FOLATE: Folate: 8.6 ng/mL (ref >3.0)

## 2020-08-19 NOTE — Telephone Encounter (Signed)
Called patient to get schedule for 5 month f/u with Dr. Alvis Lemmings. A voicemail was left for patient to call [phone] to schedule. Please add patient to Dr. Alvis Lemmings schedule sometime in December.

## 2020-08-19 NOTE — Telephone Encounter (Signed)
-----   Message from Marcine Matar, MD sent at 08/14/2020  5:26 PM EDT ----- Needs follow-up appointment with Dr. Alvis Lemmings in 5 months.

## 2020-08-19 NOTE — Progress Notes (Signed)
Let pt know that his anemia does not appear to be totally due to iron deficiency.  I have referred him to a hematologist for further evaluation.  I will also have them move up his appt with Dr. Alvis Lemmings his primary doctor at our clinic.

## 2020-08-20 ENCOUNTER — Telehealth: Payer: Self-pay

## 2020-08-20 ENCOUNTER — Telehealth: Payer: Self-pay | Admitting: Family Medicine

## 2020-08-20 NOTE — Telephone Encounter (Signed)
Contacted pt to go over lab results pt is aware and doesn't have any questions or concerns 

## 2020-08-20 NOTE — Telephone Encounter (Signed)
There's an update from Dr. Laural Benes regarding when this pt needs to be seen, that is sometime up to the 3rd week of August. Called pt to schedule appt for August w/ Dr. Alvis Lemmings. No answer,cannot LVM.

## 2020-08-20 NOTE — Telephone Encounter (Signed)
-----   Message from Marcine Matar, MD sent at 08/19/2020  9:41 PM EDT ----- I initially said for yo to schedule f/u with Dr. Alvis Lemmings in 5 mths.  Based on lab results, he needs to be seen sooner.  Please get him in with her in 6-8 wks.

## 2020-08-22 ENCOUNTER — Other Ambulatory Visit: Payer: Self-pay

## 2020-08-22 ENCOUNTER — Ambulatory Visit: Payer: BC Managed Care – PPO | Attending: Family Medicine

## 2020-08-22 DIAGNOSIS — R945 Abnormal results of liver function studies: Secondary | ICD-10-CM

## 2020-08-22 DIAGNOSIS — R7989 Other specified abnormal findings of blood chemistry: Secondary | ICD-10-CM

## 2020-08-22 DIAGNOSIS — E875 Hyperkalemia: Secondary | ICD-10-CM

## 2020-08-24 LAB — HEPATITIS C ANTIBODY: Hep C Virus Ab: <0.1 s/co ratio (ref 0.0–0.9)

## 2020-08-24 LAB — MITOCHONDRIAL ANTIBODIES: Mitochondrial Ab: <20 Units (ref 0.0–20.0)

## 2020-08-24 LAB — POTASSIUM: Potassium: 4.3 mmol/L (ref 3.5–5.2)

## 2020-08-26 NOTE — Telephone Encounter (Signed)
Pt called in stating he wanted a nurse to reach out to him about his lab results. Please advise.

## 2020-08-27 ENCOUNTER — Telehealth: Payer: Self-pay | Admitting: Hematology and Oncology

## 2020-08-27 NOTE — Telephone Encounter (Signed)
Scheduled appt per 7/12 referral. Pt aware.

## 2020-08-27 NOTE — Telephone Encounter (Signed)
Returned pt call. Went over lab results pt is scheduled to see Dr. Alvis Lemmings 8/22

## 2020-09-01 ENCOUNTER — Inpatient Hospital Stay: Payer: BC Managed Care – PPO | Attending: Hematology and Oncology | Admitting: Hematology and Oncology

## 2020-09-01 ENCOUNTER — Encounter: Payer: Self-pay | Admitting: Hematology and Oncology

## 2020-09-01 ENCOUNTER — Other Ambulatory Visit: Payer: Self-pay

## 2020-09-01 ENCOUNTER — Inpatient Hospital Stay: Payer: BC Managed Care – PPO

## 2020-09-01 VITALS — BP 139/80 | HR 77 | Temp 98.0°F | Ht 73.0 in | Wt 198.2 lb

## 2020-09-01 DIAGNOSIS — D75839 Thrombocytosis, unspecified: Secondary | ICD-10-CM | POA: Diagnosis not present

## 2020-09-01 DIAGNOSIS — D649 Anemia, unspecified: Secondary | ICD-10-CM | POA: Insufficient documentation

## 2020-09-01 LAB — CBC WITH DIFFERENTIAL/PLATELET
Abs Immature Granulocytes: 0.01 10*3/uL (ref 0.00–0.07)
Basophils Absolute: 0.1 10*3/uL (ref 0.0–0.1)
Basophils Relative: 2 %
Eosinophils Absolute: 0.4 10*3/uL (ref 0.0–0.5)
Eosinophils Relative: 7 %
HCT: 46.1 % (ref 39.0–52.0)
Hemoglobin: 16 g/dL (ref 13.0–17.0)
Immature Granulocytes: 0 %
Lymphocytes Relative: 30 %
Lymphs Abs: 1.9 10*3/uL (ref 0.7–4.0)
MCH: 29.3 pg (ref 26.0–34.0)
MCHC: 34.7 g/dL (ref 30.0–36.0)
MCV: 84.4 fL (ref 80.0–100.0)
Monocytes Absolute: 0.4 10*3/uL (ref 0.1–1.0)
Monocytes Relative: 7 %
Neutro Abs: 3.4 10*3/uL (ref 1.7–7.7)
Neutrophils Relative %: 54 %
Platelets: 246 10*3/uL (ref 150–400)
RBC: 5.46 MIL/uL (ref 4.22–5.81)
RDW: 12.9 % (ref 11.5–15.5)
WBC: 6.2 10*3/uL (ref 4.0–10.5)
nRBC: 0 % (ref 0.0–0.2)

## 2020-09-01 LAB — RETICULOCYTES
Immature Retic Fract: 6.1 % (ref 2.3–15.9)
RBC.: 5.35 MIL/uL (ref 4.22–5.81)
Retic Count, Absolute: 59.4 10*3/uL (ref 19.0–186.0)
Retic Ct Pct: 1.1 % (ref 0.4–3.1)

## 2020-09-01 LAB — HEPATITIS PANEL, ACUTE
HCV Ab: NONREACTIVE
Hep A IgM: NONREACTIVE
Hep B C IgM: NONREACTIVE
Hepatitis B Surface Ag: NONREACTIVE

## 2020-09-01 LAB — LACTATE DEHYDROGENASE: LDH: 182 U/L (ref 98–192)

## 2020-09-01 LAB — TSH: TSH: 1.125 u[IU]/mL (ref 0.320–4.118)

## 2020-09-01 NOTE — Progress Notes (Signed)
[name] CONSULT NOTE  Patient Care Team: [facility], MD as PCP - General (Family Medicine)  CHIEF COMPLAINTS/PURPOSE OF CONSULTATION:  Normocytic normochromic anemia and thrombocytosis.  ASSESSMENT & PLAN:   This is a very pleasant 60 year old male patient with a past medical history significant for dyslipidemia not on any medication, chronic back pain referred to hematology for abnormal CBC.  He denies any health complaints.  He feels quite well.  Physical examination unremarkable, no palpable lymphadenopathy or hepatosplenomegaly.  2 weeks ago his labs showed normocytic normochromic anemia and mild thrombocytosis however repeat CBC today showed resolution of high platelet count and normocytic normochromic anemia.  We initially plan to do myeloproliferative work-up but we will go ahead and try and cancel this.  I will still proceed with the rest of the testing since it is in process. When he returns to clinic for follow-up, we will review all his labs and discuss any additional recommendations. Thank you for consulting [name] in the care of this patient.  Please not hesitate to contact us with any additional questions or concerns.  We have discussed about age-appropriate cancer screening and he appears to be up-to-date with this.   HISTORY OF PRESENTING ILLNESS:   Zachary Hester 60 y.o. male is here because of anemia and thrombocytosis.  This is a pleasant 60 year old male patient with past medical history significant for chronic back pain, hypertension, dyslipidemia referred to hematology for new finding of normocytic normochromic anemia and thrombocytosis during his annual visit.  He feels very well.  He denies any health complaints.  No B symptoms, history of thromboembolic disorders, erythromelalgia or intractable pruritus.  Rest of the pertinent 10 point ROS reviewed and negative.  REVIEW OF SYSTEMS:   Constitutional: Denies fevers, chills or abnormal night  sweats Eyes: Denies blurriness of vision, double vision or watery eyes Ears, nose, mouth, throat, and face: Denies mucositis or sore throat Respiratory: Denies cough, dyspnea or wheezes Cardiovascular: Denies palpitation, chest discomfort or lower extremity swelling Gastrointestinal:  Denies nausea, heartburn or change in bowel habits Skin: Denies abnormal skin rashes Lymphatics: Denies new lymphadenopathy or easy bruising Neurological:Denies numbness, tingling or new weaknesses Behavioral/Psych: Mood is stable, no new changes  All other systems were reviewed with the patient and are negative.  MEDICAL HISTORY:  Past Medical History:  Diagnosis Date   Back pain    Chronic back pain    HTN (hypertension)    Hypercholesteremia     SURGICAL HISTORY: Past Surgical History:  Procedure Laterality Date   NO PAST SURGERIES      SOCIAL HISTORY: Social History   Socioeconomic History   Marital status: Married    Spouse name: Not on file   Number of children: 6   Years of education: Not on file   Highest education level: Not on file  Occupational History   Occupation: [employer]  Tobacco Use   Smoking status: Never   Smokeless tobacco: Never  Vaping Use   Vaping Use: Never used  Substance and Sexual Activity   Alcohol use: No   Drug use: No   Sexual activity: Yes  Other Topics Concern   Not on file  Social History Narrative   Not on file   Social Determinants of Health   Financial Resource Strain: Not on file  Food Insecurity: Not on file  Transportation Needs: Not on file  Physical Activity: Not on file  Stress: Not on file  Social Connections: Not on file  Intimate  Partner Violence: Not on file    FAMILY HISTORY: Family History  Problem Relation Age of Onset   Hypertension Mother    Heart disease Sister    Diabetes Sister     ALLERGIES:  has No Known Allergies.  MEDICATIONS:  Current Outpatient Medications  Medication Sig Dispense Refill    atorvastatin (LIPITOR) 40 MG tablet Take 1 tablet (40 mg total) by mouth daily. (Patient not taking: Reported on 09/01/2020) 90 tablet 1   No current facility-administered medications for this visit.     PHYSICAL EXAMINATION:  ECOG PERFORMANCE STATUS: 0 - Asymptomatic  Vitals:   09/01/20 1104  BP: 139/80  Pulse: 77  Temp: 98 F (36.7 C)  SpO2: 100%   Filed Weights   09/01/20 1104  Weight: 198 lb 3.2 oz (89.9 kg)    GENERAL:alert, no distress and comfortable SKIN: skin color, texture, turgor are normal, no rashes or significant lesions EYES: normal, conjunctiva are pink and non-injected, sclera clear OROPHARYNX:no exudate, no erythema and lips, buccal mucosa, and tongue normal  NECK: supple, thyroid normal size, non-tender, without nodularity LYMPH:  no palpable lymphadenopathy in the cervical, axillary or inguinal LUNGS: clear to auscultation and percussion with normal breathing effort HEART: regular rate & rhythm and no murmurs and no lower extremity edema ABDOMEN:abdomen soft, non-tender and normal bowel sounds Musculoskeletal:no cyanosis of digits and no clubbing  PSYCH: alert & oriented x 3 with fluent speech NEURO: no focal motor/sensory deficits  LABORATORY DATA:  I have reviewed the data as listed Lab Results  Component Value Date   WBC 10.1 08/14/2020   HGB 9.4 (L) 08/14/2020   HCT 31.1 (L) 08/14/2020   MCV 88 08/14/2020   PLT 726 (H) 08/14/2020     Chemistry      Component Value Date/Time   NA 133 (L) 08/14/2020 1452   K 4.3 08/22/2020 1408   CL 95 (L) 08/14/2020 1452   CO2 19 (L) 08/14/2020 1452   BUN 10 08/14/2020 1452   CREATININE 0.63 (L) 08/14/2020 1452      Component Value Date/Time   CALCIUM 9.6 08/14/2020 1452   ALKPHOS 229 (H) 08/14/2020 1452   AST 47 (H) 08/14/2020 1452   ALT 23 08/14/2020 1452   BILITOT 0.6 08/14/2020 1452     Reviewed his labs from 2 weeks ago which showed normocytic normochromic anemia and platelet count of 726,000.   However repeat labs today showed entirely normal complete blood count.  Hence we will cancel the JAK2 and erythropoietin labs.  RADIOGRAPHIC STUDIES: I have personally reviewed the radiological images as listed and agreed with the findings in the report. No results found.  All questions were answered. The patient knows to call the clinic with any problems, questions or concerns. I spent 45 minutes in the care of this patient including H and P, review of records, counseling and coordination of care.     Rachel Moulds, MD 09/01/2020 11:42 AM

## 2020-09-02 LAB — HIV-1 RNA QUANT-NO REFLEX-BLD
HIV 1 RNA Quant: <20 copies/mL
LOG10 HIV-1 RNA: UNDETERMINED log10copy/mL

## 2020-09-08 LAB — ANTINUCLEAR ANTIBODIES, IFA: ANA Ab, IFA: NEGATIVE

## 2020-10-01 ENCOUNTER — Other Ambulatory Visit: Payer: Self-pay

## 2020-10-01 ENCOUNTER — Inpatient Hospital Stay: Payer: BC Managed Care – PPO | Attending: Hematology and Oncology | Admitting: Hematology and Oncology

## 2020-10-01 VITALS — BP 136/87 | HR 72 | Temp 97.8°F | Resp 17 | Ht 73.0 in | Wt 194.4 lb

## 2020-10-01 DIAGNOSIS — D649 Anemia, unspecified: Secondary | ICD-10-CM | POA: Diagnosis not present

## 2020-10-01 DIAGNOSIS — Z79899 Other long term (current) drug therapy: Secondary | ICD-10-CM | POA: Insufficient documentation

## 2020-10-01 DIAGNOSIS — E7800 Pure hypercholesterolemia, unspecified: Secondary | ICD-10-CM | POA: Diagnosis not present

## 2020-10-01 DIAGNOSIS — D75839 Thrombocytosis, unspecified: Secondary | ICD-10-CM | POA: Insufficient documentation

## 2020-10-01 DIAGNOSIS — I10 Essential (primary) hypertension: Secondary | ICD-10-CM | POA: Insufficient documentation

## 2020-10-01 NOTE — Progress Notes (Signed)
[facility] CONSULT NOTE  Patient Care Team: Hoy Register, MD as PCP - General (Family Medicine)  CHIEF COMPLAINTS/PURPOSE OF CONSULTATION:  Normocytic normochromic anemia and thrombocytosis.  ASSESSMENT & PLAN:   This is a very pleasant 60 year old male patient with a past medical history significant for dyslipidemia not on any medication, chronic back pain referred to hematology for abnormal CBC. Review of his labs shows resolution of anemia and thrombocytosis.  ANA, TSH, Hep panel and HIV NR. At this time, since he has no more active hematological issues, he doesn't have to return to FU with hematology. Follow up with primary with any other health concerns.  Thank you for consulting [name] in the care of this patient.  Please do not hesitate to contact us with any additional questions or concerns.  We have discussed about age-appropriate cancer screening and he appears to be up-to-date with this.   HISTORY OF PRESENTING ILLNESS:   Zachary Hester 60 y.o. male is here because of anemia and thrombocytosis.  This is a pleasant 60 year old male patient with past medical history significant for chronic back pain, hypertension, dyslipidemia referred to hematology for new finding of normocytic normochromic anemia and thrombocytosis during his annual visit.    Interval history  No new complaints Skin rash on both forearms and private occasionally with itching. NO interim infections or hospitalizations. No medications currently, stopped lipitor per his PCP   REVIEW OF SYSTEMS:   Constitutional: Denies fevers, chills or abnormal night sweats Eyes: Denies blurriness of vision, double vision or watery eyes Ears, nose, mouth, throat, and face: Denies mucositis or sore throat Respiratory: Denies cough, dyspnea or wheezes Cardiovascular: Denies palpitation, chest discomfort or lower extremity swelling Gastrointestinal:  Denies nausea, heartburn or change in bowel  habits Skin: Denies abnormal skin rashes Lymphatics: Denies new lymphadenopathy or easy bruising Neurological:Denies numbness, tingling or new weaknesses Behavioral/Psych: Mood is stable, no new changes  All other systems were reviewed with the patient and are negative.  MEDICAL HISTORY:  Past Medical History:  Diagnosis Date   Back pain    Chronic back pain    HTN (hypertension)    Hypercholesteremia     SURGICAL HISTORY: Past Surgical History:  Procedure Laterality Date   NO PAST SURGERIES      SOCIAL HISTORY: Social History   Socioeconomic History   Marital status: Married    Spouse name: Not on file   Number of children: 6   Years of education: Not on file   Highest education level: Not on file  Occupational History   Occupation: [employer]  Tobacco Use   Smoking status: Never   Smokeless tobacco: Never  Vaping Use   Vaping Use: Never used  Substance and Sexual Activity   Alcohol use: No   Drug use: No   Sexual activity: Yes  Other Topics Concern   Not on file  Social History Narrative   Not on file   Social Determinants of Health   Financial Resource Strain: Not on file  Food Insecurity: Not on file  Transportation Needs: Not on file  Physical Activity: Not on file  Stress: Not on file  Social Connections: Not on file  Intimate Partner Violence: Not on file    FAMILY HISTORY: Family History  Problem Relation Age of Onset   Hypertension Mother    Heart disease Sister    Diabetes Sister     ALLERGIES:  has No Known Allergies.  MEDICATIONS:  Current Outpatient Medications  Medication  Sig Dispense Refill   atorvastatin (LIPITOR) 40 MG tablet Take 1 tablet (40 mg total) by mouth daily. (Patient not taking: Reported on 09/01/2020) 90 tablet 1   No current facility-administered medications for this visit.     PHYSICAL EXAMINATION:  ECOG PERFORMANCE STATUS: 0 - Asymptomatic  There were no vitals filed for this visit.  There were no  vitals filed for this visit.    LABORATORY DATA:  I have reviewed the data as listed Lab Results  Component Value Date   WBC 6.2 09/01/2020   HGB 16.0 09/01/2020   HCT 46.1 09/01/2020   MCV 84.4 09/01/2020   PLT 246 09/01/2020     Chemistry      Component Value Date/Time   NA 133 (L) 08/14/2020 1452   K 4.3 08/22/2020 1408   CL 95 (L) 08/14/2020 1452   CO2 19 (L) 08/14/2020 1452   BUN 10 08/14/2020 1452   CREATININE 0.63 (L) 08/14/2020 1452      Component Value Date/Time   CALCIUM 9.6 08/14/2020 1452   ALKPHOS 229 (H) 08/14/2020 1452   AST 47 (H) 08/14/2020 1452   ALT 23 08/14/2020 1452   BILITOT 0.6 08/14/2020 1452     Reviewed his labs from last visit.  CBC shows no evidence of anemia or thrombocytosis.  TSH normal.  No evidence of hemolysis.  ANA normal.  Hepatitis panel and HIV testing unremarkable.  RADIOGRAPHIC STUDIES: I have personally reviewed the radiological images as listed and agreed with the findings in the report. No results found.  All questions were answered. The patient knows to call the clinic with any problems, questions or concerns. I spent in the care of this patient including H, review of records, counseling and coordination of care.     Rachel Moulds, MD 10/01/2020 3:48 PM

## 2021-09-16 ENCOUNTER — Ambulatory Visit: Payer: BC Managed Care – PPO | Admitting: Critical Care Medicine

## 2021-09-21 ENCOUNTER — Ambulatory Visit: Payer: BC Managed Care – PPO | Attending: Critical Care Medicine | Admitting: Family Medicine

## 2021-09-21 ENCOUNTER — Encounter: Payer: Self-pay | Admitting: Family Medicine

## 2021-09-21 ENCOUNTER — Ambulatory Visit: Payer: BC Managed Care – PPO | Admitting: Family Medicine

## 2021-09-21 VITALS — BP 99/64 | HR 85 | Temp 98.2°F | Resp 14 | Ht 74.0 in | Wt 201.0 lb

## 2021-09-21 DIAGNOSIS — R399 Unspecified symptoms and signs involving the genitourinary system: Secondary | ICD-10-CM

## 2021-09-21 DIAGNOSIS — Z131 Encounter for screening for diabetes mellitus: Secondary | ICD-10-CM | POA: Diagnosis not present

## 2021-09-21 DIAGNOSIS — E7800 Pure hypercholesterolemia, unspecified: Secondary | ICD-10-CM | POA: Diagnosis not present

## 2021-09-21 DIAGNOSIS — L299 Pruritus, unspecified: Secondary | ICD-10-CM | POA: Diagnosis not present

## 2021-09-21 MED ORDER — TAMSULOSIN HCL 0.4 MG PO CAPS: 0.4000 mg | ORAL_CAPSULE | Freq: Every day | ORAL | 1 refills | Status: DC

## 2021-09-21 NOTE — Patient Instructions (Signed)
Exercising to Stay Healthy To become healthy and stay healthy, it is recommended that you do moderate-intensity and vigorous-intensity exercise. You can tell that you are exercising at a moderate intensity if your heart starts beating faster and you start breathing faster but can still hold a conversation. You can tell that you are exercising at a vigorous intensity if you are breathing much harder and faster and cannot hold a conversation while exercising. How can exercise benefit me? Exercising regularly is important. It has many health benefits, such as: Improving overall fitness, flexibility, and endurance. Increasing bone density. Helping with weight control. Decreasing body fat. Increasing muscle strength and endurance. Reducing stress and tension, anxiety, depression, or anger. Improving overall health. What guidelines should I follow while exercising? Before you start a new exercise program, talk with your health care provider. Do not exercise so much that you hurt yourself, feel dizzy, or get very short of breath. Wear comfortable clothes and wear shoes with good support. Drink plenty of water while you exercise to prevent dehydration or heat stroke. Work out until your breathing and your heartbeat get faster (moderate intensity). How often should I exercise? Choose an activity that you enjoy, and set realistic goals. Your health care provider can help you make an activity plan that is individually designed and works best for you. Exercise regularly as told by your health care provider. This may include: Doing strength training two times a week, such as: Lifting weights. Using resistance bands. Push-ups. Sit-ups. Yoga. Doing a certain intensity of exercise for a given amount of time. Choose from these options: A total of 150 minutes of moderate-intensity exercise every week. A total of 75 minutes of vigorous-intensity exercise every week. A mix of moderate-intensity and  vigorous-intensity exercise every week. Children, pregnant women, people who have not exercised regularly, people who are overweight, and older adults may need to talk with a health care provider about what activities are safe to perform. If you have a medical condition, be sure to talk with your health care provider before you start a new exercise program. What are some exercise ideas? Moderate-intensity exercise ideas include: Walking 1 mile (1.6 km) in about 15 minutes. Biking. Hiking. Golfing. Dancing. Water aerobics. Vigorous-intensity exercise ideas include: Walking 4.5 miles (7.2 km) or more in about 1 hour. Jogging or running 5 miles (8 km) in about 1 hour. Biking 10 miles (16.1 km) or more in about 1 hour. Lap swimming. Roller-skating or in-line skating. Cross-country skiing. Vigorous competitive sports, such as football, basketball, and soccer. Jumping rope. Aerobic dancing. What are some everyday activities that can help me get exercise? Yard work, such as: Pushing a lawn mower. Raking and bagging leaves. Washing your car. Pushing a stroller. Shoveling snow. Gardening. Washing windows or floors. How can I be more active in my day-to-day activities? Use stairs instead of an elevator. Take a walk during your lunch break. If you drive, park your car farther away from your work or school. If you take public transportation, get off one stop early and walk the rest of the way. Stand up or walk around during all of your indoor phone calls. Get up, stretch, and walk around every 30 minutes throughout the day. Enjoy exercise with a friend. Support to continue exercising will help you keep a regular routine of activity. Where to find more information You can find more information about exercising to stay healthy from: U.S. Department of Health and Human Services: www.hhs.gov Centers for Disease Control and Prevention (  CDC): www.cdc.gov Summary Exercising regularly is  important. It will improve your overall fitness, flexibility, and endurance. Regular exercise will also improve your overall health. It can help you control your weight, reduce stress, and improve your bone density. Do not exercise so much that you hurt yourself, feel dizzy, or get very short of breath. Before you start a new exercise program, talk with your health care provider. This information is not intended to replace advice given to you by your health care provider. Make sure you discuss any questions you have with your health care provider. Document Revised: 05/23/2020 Document Reviewed: 05/23/2020 Elsevier Patient Education  2023 Elsevier Inc.  

## 2021-09-21 NOTE — Progress Notes (Signed)
Subjective:  Patient ID: Zachary Hester, male    DOB: March 06, 1960  Age: 61 y.o. MRN: [id number]  CC: Annual Exam (Stopped taking lipitor when last lipid levels came back normal)   HPI Zachary Hester is a 61 y.o. year old male with a history of low back pain from spinal stenosis, hyperlipidemia who presents today for a follow-up visit. Last office visit was in 08/2020.  Interval History: His back pain is doing good and he is currently not taking any analgesics.  He discontinued his Lipitor on his own after his last lipid panel returned normal.  Last week he noticed itching in his left nipple which lasted 2 days then resolved.  Symptoms have not returned and he denies presence of itching in other nipple, denies presence of breast masses and has no itching in other body parts. He has nocturia of up to 6x/night, he has urgency but no dysuria, no hematuria, no straining.  His major form of exercise is at his job. Past Medical History:  Diagnosis Date   Back pain    Chronic back pain    HTN (hypertension)    Hypercholesteremia     Past Surgical History:  Procedure Laterality Date   NO PAST SURGERIES      Family History  Problem Relation Age of Onset   Hypertension Mother    Heart disease Sister    Diabetes Sister     Social History   Socioeconomic History   Marital status: Married    Spouse name: Not on file   Number of children: 6   Years of education: Not on file   Highest education level: Not on file  Occupational History   Occupation: [employer]  Tobacco Use   Smoking status: Never   Smokeless tobacco: Never  Vaping Use   Vaping Use: Never used  Substance and Sexual Activity   Alcohol use: No   Drug use: No   Sexual activity: Yes  Other Topics Concern   Not on file  Social History Narrative   Not on file   Social Determinants of Health   Financial Resource Strain: Low Risk  (06/21/2018)   Overall Financial Resource Strain (CARDIA)    Difficulty  of Paying Living Expenses: Not hard at all  Food Insecurity: No Food Insecurity (06/21/2018)   Hunger Vital Sign    Worried About Running Out of Food in the Last Year: Never true    [place] in the Last Year: Never true  Transportation Needs: No Transportation Needs (06/21/2018)   PRAPARE - Hydrologist (Medical): No    Lack of Transportation (Non-Medical): No  Physical Activity: Inactive (06/21/2018)   Exercise Vital Sign    Days of Exercise per Week: 0 days    Minutes of Exercise per Session: 0 min  Stress: No Stress Concern Present (06/21/2018)   [place]    Feeling of Stress : Not at all  Social Connections: Moderately Integrated (06/21/2018)   Social Connection and Isolation Panel [NHANES]    Frequency of Communication with Friends and Family: Once a week    Frequency of Social Gatherings with Friends and Family: Once a week    Attends Religious Services: More than 4 times per year    Active Member of [employer] or Organizations: Yes    Attends Archivist Meetings: Never    Marital Status: Married    No Known Allergies  Outpatient Medications Prior to Visit  Medication Sig Dispense Refill   atorvastatin (LIPITOR) 40 MG tablet Take 1 tablet (40 mg total) by mouth daily. (Patient not taking: Reported on 09/01/2020) 90 tablet 1   No facility-administered medications prior to visit.     ROS Review of Systems  Constitutional:  Negative for activity change and appetite change.  HENT:  Negative for sinus pressure and sore throat.   Respiratory:  Negative for chest tightness, shortness of breath and wheezing.   Cardiovascular:  Negative for chest pain and palpitations.  Gastrointestinal:  Negative for abdominal distention, abdominal pain and constipation.  Genitourinary: Negative.   Musculoskeletal: Negative.   Psychiatric/Behavioral:  Negative for behavioral problems and  dysphoric mood.     Objective:  BP 99/64 (BP Location: Left Arm, Patient Position: Sitting, Cuff Size: Large)   Pulse 85   Temp 98.2 F (36.8 C)   Resp 14   Ht 6' 2"  (1.88 m)   Wt 201 lb (91.2 kg)   SpO2 98%   BMI 25.81 kg/m      09/21/2021    3:09 PM 10/01/2020    3:50 PM 09/01/2020   11:04 AM  BP/Weight  Systolic BP 99 644 034  Diastolic BP 64 87 80  Wt. (Lbs) 201 194.4 198.2  BMI 25.81 kg/m2 25.65 kg/m2 26.15 kg/m2      Physical Exam Constitutional:      Appearance: He is well-developed.  Cardiovascular:     Rate and Rhythm: Normal rate.     Heart sounds: Normal heart sounds. No murmur heard. Pulmonary:     Effort: Pulmonary effort is normal.     Breath sounds: Normal breath sounds. No wheezing or rales.  Chest:     Chest wall: No tenderness.  Breasts:    Right: No mass, nipple discharge or skin change.     Left: No mass, nipple discharge or skin change.  Abdominal:     General: Bowel sounds are normal. There is no distension.     Palpations: Abdomen is soft. There is no mass.     Tenderness: There is no abdominal tenderness.  Musculoskeletal:        General: Normal range of motion.     Right lower leg: No edema.     Left lower leg: No edema.  Neurological:     Mental Status: He is alert and oriented to person, place, and time.  Psychiatric:        Mood and Affect: Mood normal.        Latest Ref Rng & Units 08/22/2020    2:08 PM 08/14/2020    2:52 PM 12/21/2019    3:27 PM  CMP  Glucose 65 - 99 mg/dL  69  81   BUN 8 - 27 mg/dL  10  12   Creatinine 0.76 - 1.27 mg/dL  0.63  0.86   Sodium 134 - 144 mmol/L  133  140   Potassium 3.5 - 5.2 mmol/L 4.3  5.5  4.2   Chloride 96 - 106 mmol/L  95  105   CO2 20 - 29 mmol/L  19  21   Calcium 8.6 - 10.2 mg/dL  9.6  9.4   Total Protein 6.0 - 8.5 g/dL  8.2  6.9   Total Bilirubin 0.0 - 1.2 mg/dL  0.6  0.9   Alkaline Phos 44 - 121 IU/L  229  113   AST 0 - 40 IU/L  47  17   ALT 0 -  44 IU/L  23  15     Lipid  Panel     Component Value Date/Time   CHOL 125 08/14/2020 1452   TRIG 89 08/14/2020 1452   HDL 26 (L) 08/14/2020 1452   CHOLHDL 4.8 08/14/2020 1452   CHOLHDL 5.3 Ratio 11/05/2009 2215   VLDL 30 11/05/2009 2215   LDLCALC 82 08/14/2020 1452    CBC    Component Value Date/Time   WBC 6.2 09/01/2020 1159   RBC 5.35 09/01/2020 1200   RBC 5.46 09/01/2020 1159   HGB 16.0 09/01/2020 1159   HGB 9.4 (L) 08/14/2020 1452   HCT 46.1 09/01/2020 1159   HCT 31.1 (L) 08/14/2020 1452   PLT 246 09/01/2020 1159   PLT 726 (H) 08/14/2020 1452   MCV 84.4 09/01/2020 1159   MCV 88 08/14/2020 1452   MCH 29.3 09/01/2020 1159   MCHC 34.7 09/01/2020 1159   RDW 12.9 09/01/2020 1159   RDW 14.1 08/14/2020 1452   LYMPHSABS 1.9 09/01/2020 1159   LYMPHSABS 2.1 08/08/2017 1638   MONOABS 0.4 09/01/2020 1159   EOSABS 0.4 09/01/2020 1159   EOSABS 0.4 08/08/2017 1638   BASOSABS 0.1 09/01/2020 1159   BASOSABS 0.1 08/08/2017 1638    Lab Results  Component Value Date   HGBA1C 5.7 (H) 08/14/2020   Assessment & Plan:  1. Hypercholesteremia Last lipid panel was normal He discontinued Lipitor without any medical recommendations to do so We will check his lipid panel now and if elevated we will need to restart Lipitor Low-cholesterol diet - LP+Non-HDL Cholesterol - CMP14+EGFR  2. Lower urinary tract symptoms (LUTS) Suspicious for BPH - PSA, total and free - tamsulosin (FLOMAX) 0.4 MG CAPS capsule; Take 1 capsule (0.4 mg total) by mouth daily.  Dispense: 90 capsule; Refill: 1  3. Pruritus Breast exam is normal Trial of hydrocortisone if symptoms reoccurs Advised to notify me if he notices any abnormalities of breast lump - CBC with Differential/Platelet  4. Screening for diabetes mellitus - Hemoglobin A1c    Meds ordered this encounter  Medications   tamsulosin (FLOMAX) 0.4 MG CAPS capsule    Sig: Take 1 capsule (0.4 mg total) by mouth daily.    Dispense:  90 capsule    Refill:  1     Follow-up: Return in about 1 year (around 09/22/2022) for Chronic medical conditions.       Charlott Rakes, MD, FAAFP. [facility] [place], [place]   09/21/2021, 4:28 PM

## 2021-09-22 ENCOUNTER — Other Ambulatory Visit: Payer: Self-pay | Admitting: Family Medicine

## 2021-09-22 ENCOUNTER — Other Ambulatory Visit: Payer: Self-pay

## 2021-09-22 DIAGNOSIS — E7800 Pure hypercholesterolemia, unspecified: Secondary | ICD-10-CM

## 2021-09-22 LAB — CMP14+EGFR
ALT: 13 IU/L (ref 0–44)
AST: 17 IU/L (ref 0–40)
Albumin/Globulin Ratio: 1.5 (ref 1.2–2.2)
Albumin: 4.2 g/dL (ref 3.9–4.9)
Alkaline Phosphatase: 114 IU/L (ref 44–121)
BUN/Creatinine Ratio: 15 (ref 10–24)
BUN: 14 mg/dL (ref 8–27)
Bilirubin Total: 0.5 mg/dL (ref 0.0–1.2)
CO2: 20 mmol/L (ref 20–29)
Calcium: 9.7 mg/dL (ref 8.6–10.2)
Chloride: 105 mmol/L (ref 96–106)
Creatinine, Ser: 0.95 mg/dL (ref 0.76–1.27)
Globulin, Total: 2.8 g/dL (ref 1.5–4.5)
Glucose: 91 mg/dL (ref 70–99)
Potassium: 3.9 mmol/L (ref 3.5–5.2)
Sodium: 140 mmol/L (ref 134–144)
Total Protein: 7 g/dL (ref 6.0–8.5)
eGFR: 91 mL/min/{1.73_m2} (ref >59)

## 2021-09-22 LAB — HEMOGLOBIN A1C
Est. average glucose Bld gHb Est-mCnc: 117 mg/dL
Hgb A1c MFr Bld: 5.7 % — ABNORMAL HIGH (ref 4.8–5.6)

## 2021-09-22 LAB — LP+NON-HDL CHOLESTEROL
Cholesterol, Total: 251 mg/dL — ABNORMAL HIGH (ref 100–199)
HDL: 48 mg/dL (ref >39)
LDL Chol Calc (NIH): 180 mg/dL — ABNORMAL HIGH (ref 0–99)
Total Non-HDL-Chol (LDL+VLDL): 203 mg/dL — ABNORMAL HIGH (ref 0–129)
Triglycerides: 129 mg/dL (ref 0–149)
VLDL Cholesterol Cal: 23 mg/dL (ref 5–40)

## 2021-09-22 LAB — CBC WITH DIFFERENTIAL/PLATELET
Basophils Absolute: 0.1 10*3/uL (ref 0.0–0.2)
Basos: 2 %
EOS (ABSOLUTE): 0.4 10*3/uL (ref 0.0–0.4)
Eos: 6 %
Hematocrit: 47.6 % (ref 37.5–51.0)
Hemoglobin: 16.1 g/dL (ref 13.0–17.7)
Immature Grans (Abs): 0 10*3/uL (ref 0.0–0.1)
Immature Granulocytes: 0 %
Lymphocytes Absolute: 2.1 10*3/uL (ref 0.7–3.1)
Lymphs: 30 %
MCH: 29.1 pg (ref 26.6–33.0)
MCHC: 33.8 g/dL (ref 31.5–35.7)
MCV: 86 fL (ref 79–97)
Monocytes Absolute: 0.5 10*3/uL (ref 0.1–0.9)
Monocytes: 7 %
Neutrophils Absolute: 4 10*3/uL (ref 1.4–7.0)
Neutrophils: 55 %
Platelets: 283 10*3/uL (ref 150–450)
RBC: 5.53 x10E6/uL (ref 4.14–5.80)
RDW: 13.1 % (ref 11.6–15.4)
WBC: 7.2 10*3/uL (ref 3.4–10.8)

## 2021-09-22 LAB — PSA, TOTAL AND FREE
PSA, Free Pct: 21.9 %
PSA, Free: 1.27 ng/mL
Prostate Specific Ag, Serum: 5.8 ng/mL — ABNORMAL HIGH (ref 0.0–4.0)

## 2021-09-22 MED ORDER — ATORVASTATIN CALCIUM 40 MG PO TABS
40.0000 mg | ORAL_TABLET | Freq: Every day | ORAL | 1 refills | Status: DC
  Filled 2021-09-22: qty 30, 30d supply, fill #0

## 2021-09-28 ENCOUNTER — Other Ambulatory Visit: Payer: Self-pay

## 2021-10-08 ENCOUNTER — Other Ambulatory Visit: Payer: Self-pay | Admitting: Internal Medicine

## 2021-10-08 DIAGNOSIS — E7800 Pure hypercholesterolemia, unspecified: Secondary | ICD-10-CM

## 2022-02-18 ENCOUNTER — Other Ambulatory Visit: Payer: Self-pay | Admitting: Family Medicine

## 2022-02-18 ENCOUNTER — Other Ambulatory Visit: Payer: Self-pay

## 2022-02-18 DIAGNOSIS — E7800 Pure hypercholesterolemia, unspecified: Secondary | ICD-10-CM

## 2022-02-18 MED ORDER — ATORVASTATIN CALCIUM 40 MG PO TABS
ORAL_TABLET | ORAL | 1 refills | Status: DC
  Filled 2022-02-18: qty 30, 30d supply, fill #0

## 2022-02-18 NOTE — Telephone Encounter (Signed)
Medication Refill - Medication: atorvastatin (LIPITOR) 40 MG tablet   Has the patient contacted their pharmacy? No is going to a different pharmacy (Agent: If no, request that the patient contact the pharmacy for the refill. If patient does not wish to contact the pharmacy document the reason why and proceed with request.) (Agent: If yes, when and what did the pharmacy advise?)  Preferred Pharmacy (with phone number or street name): [place], [place], [place] [address] Has the patient been seen for an appointment in the last year OR does the patient have an upcoming appointment? yes  Agent: Please be advised that RX refills may take up to 3 business days. We ask that you follow-up with your pharmacy.

## 2022-02-18 NOTE — Telephone Encounter (Signed)
Requested Prescriptions  Pending Prescriptions Disp Refills   atorvastatin (LIPITOR) 40 MG tablet 90 tablet 1    Sig: TAKE 1 TABLET(40 MG) BY MOUTH DAILY     Cardiovascular:  Antilipid - Statins Failed - 02/18/2022  9:36 AM      Failed - Lipid Panel in normal range within the last 12 months    Cholesterol, Total  Date Value Ref Range Status  09/21/2021 251 (H) 100 - 199 mg/dL Final   LDL Chol Calc (NIH)  Date Value Ref Range Status  09/21/2021 180 (H) 0 - 99 mg/dL Final   HDL  Date Value Ref Range Status  09/21/2021 48 >39 mg/dL Final   Triglycerides  Date Value Ref Range Status  09/21/2021 129 0 - 149 mg/dL Final         Passed - Patient is not pregnant      Passed - Valid encounter within last 12 months    Recent Outpatient Visits           5 months ago [place], Charlane Ferretti, MD   1 year ago Diabetes mellitus screening   [place] Ladell Pier, MD   3 years ago Spinal stenosis at L4-L5 level   [place], Enobong, MD   3 years ago Encounter to establish care   [facility], Delfino Lovett, NP   4 years ago Hypercholesteremia   [facility] Charlott Rakes, MD

## 2022-04-19 ENCOUNTER — Other Ambulatory Visit: Payer: Self-pay

## 2022-04-19 ENCOUNTER — Emergency Department (HOSPITAL_COMMUNITY): Payer: BC Managed Care – PPO

## 2022-04-19 ENCOUNTER — Emergency Department (HOSPITAL_COMMUNITY)
Admission: EM | Admit: 2022-04-19 | Discharge: 2022-04-19 | Disposition: A | Discharge status: Home / Self Care | Payer: BC Managed Care – PPO | Attending: Emergency Medicine | Admitting: Emergency Medicine

## 2022-04-19 DIAGNOSIS — W208XXA Other cause of strike by thrown, projected or falling object, initial encounter: Secondary | ICD-10-CM | POA: Insufficient documentation

## 2022-04-19 DIAGNOSIS — S93401A Sprain of unspecified ligament of right ankle, initial encounter: Secondary | ICD-10-CM

## 2022-04-19 DIAGNOSIS — M25571 Pain in right ankle and joints of right foot: Secondary | ICD-10-CM | POA: Diagnosis present
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
## 2022-04-19 NOTE — Discharge Instructions (Addendum)
Take ibuprofen or Tylenol for pain.  You can apply ice.  Keep her foot up and elevated.  Wear the boot for the next week or until cleared by orthopedics.

## 2022-04-19 NOTE — Progress Notes (Signed)
Orthopedic Tech Progress Note Patient Details:  Zachary Hester 1960-12-16 [id number]  Ortho Devices Type of Ortho Device: Crutches, CAM walker Ortho Device/Splint Location: rle Ortho Device/Splint Interventions: Ordered, Application, Adjustment   Post Interventions Patient Tolerated: Well Instructions Provided: Care of device, Adjustment of device  [name] 04/19/2022, 4:59 AM
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
## 2022-08-22 ENCOUNTER — Ambulatory Visit (HOSPITAL_COMMUNITY): Payer: BC Managed Care – PPO

## 2022-08-22 ENCOUNTER — Other Ambulatory Visit: Payer: Self-pay

## 2022-08-22 ENCOUNTER — Ambulatory Visit (HOSPITAL_COMMUNITY)
Admission: EM | Admit: 2022-08-22 | Discharge: 2022-08-22 | Disposition: A | Discharge status: Home / Self Care | Payer: BC Managed Care – PPO | Attending: Emergency Medicine | Admitting: Emergency Medicine

## 2022-08-22 ENCOUNTER — Encounter (HOSPITAL_COMMUNITY): Payer: Self-pay | Admitting: Emergency Medicine

## 2022-08-22 DIAGNOSIS — S99921A Unspecified injury of right foot, initial encounter: Secondary | ICD-10-CM | POA: Diagnosis not present

## 2022-08-22 MED ORDER — LIDOCAINE-EPINEPHRINE-TETRACAINE (LET) TOPICAL GEL
TOPICAL | Status: AC
  Filled 2022-08-22: qty 3

## 2022-08-22 MED ORDER — IBUPROFEN 600 MG PO TABS: 600.0000 mg | ORAL_TABLET | Freq: Four times a day (QID) | ORAL | 0 refills | Status: AC | PRN

## 2022-08-22 NOTE — ED Provider Notes (Signed)
[facility]    CSN: [id number] Arrival date & time: 08/22/22  1008      History   Chief Complaint Chief Complaint  Patient presents with   Foreign Body    HPI Zachary Hester is a 62 y.o. male.  3 days ago he was barefoot in his home and stepped on a toothpick. Yesterday the foot started hurting and he has a little cut. Didn't know if there is any part of toothpick still in foot. Pain is only when he puts pressure on it Has not attempted intervention  No hx DM  Past Medical History:  Diagnosis Date   Back pain    Chronic back pain    HTN (hypertension)    Hypercholesteremia     Patient Active Problem List   Diagnosis Date Noted   Prediabetes 08/19/2020   Normocytic anemia 08/19/2020   Chronic pain syndrome 09/23/2017   Spinal stenosis at L4-L5 level 06/27/2017   Chronic bilateral low back pain with left-sided sciatica 06/28/2016   Hypercholesteremia 06/21/2016   Vitamin D deficiency 06/21/2016   Heartburn 06/21/2016   Heme positive stool 06/15/2016   Generalized abdominal pain 06/15/2016   Chronic back pain     Past Surgical History:  Procedure Laterality Date   NO PAST SURGERIES         Home Medications    Prior to Admission medications   Medication Sig Start Date End Date Taking? Authorizing Provider  ibuprofen (ADVIL) 600 MG tablet Take 1 tablet (600 mg total) by mouth every 6 (six) hours as needed. 08/22/22  Yes Joaquina Nissen, Lurena Joiner, PA-C  tamsulosin (FLOMAX) 0.4 MG CAPS capsule Take 1 capsule (0.4 mg total) by mouth daily. 09/21/21   Hoy Register, MD    Family History Family History  Problem Relation Age of Onset   Hypertension Mother    Heart disease Sister    Diabetes Sister     Social History Social History   Tobacco Use   Smoking status: Never   Smokeless tobacco: Never  Vaping Use   Vaping status: Never Used  Substance Use Topics   Alcohol use: No   Drug use: No     Allergies   Patient has no known  allergies.   Review of Systems Review of Systems As per HPI  Physical Exam Triage Vital Signs ED Triage Vitals  Encounter Vitals Group     BP 08/22/22 1051 (!) 151/87     Systolic BP Percentile --      Diastolic BP Percentile --      Pulse Rate 08/22/22 1051 88     Resp 08/22/22 1051 20     Temp 08/22/22 1051 97.8 F (36.6 C)     Temp Source 08/22/22 1051 Oral     SpO2 08/22/22 1051 97 %     Weight --      Height --      Head Circumference --      Peak Flow --      Pain Score 08/22/22 1049 8     Pain Loc --      Pain Education --      Exclude from Growth Chart --    No data found.  Updated Vital Signs BP (!) 151/87 (BP Location: Left Arm)   Pulse 88   Temp 97.8 F (36.6 C) (Oral)   Resp 20   SpO2 97%    Physical Exam Vitals and nursing note reviewed.  Constitutional:      General:  He is not in acute distress. HENT:     Mouth/Throat:     Pharynx: Oropharynx is clear.  Cardiovascular:     Rate and Rhythm: Normal rate and regular rhythm.     Pulses: Normal pulses.  Pulmonary:     Effort: Pulmonary effort is normal.  Musculoskeletal:       Feet:  Feet:     Comments: Small area on right ball of foot that is tender. Small callous formation over what was likely a puncture wound. There is no obvious foreign body, cannot palpate any object beneath. No swelling, erythema, fluctuance. No bleeding or drainage from area. Distal sensation intact. Cap refill < 2 seconds. No other foot lesions or ulcers  Skin:    General: Skin is warm and dry.     Capillary Refill: Capillary refill takes less than 2 seconds.  Neurological:     Mental Status: He is alert and oriented to person, place, and time.    UC Treatments / Results  Labs (all labs ordered are listed, but only abnormal results are displayed) Labs Reviewed - No data to display  EKG   Radiology No results found.  Procedures Procedures (including critical care time)  Medications Ordered in  UC Medications - No data to display  Initial Impression / Assessment and Plan / UC Course  I have reviewed the triage vital signs and the nursing notes.  Pertinent labs & imaging results that were available during my care of the patient were reviewed by me and considered in my medical decision making (see chart for details).  There is no indication for xray today given organic material would not show on imaging and there is no obvious foreign body on exam. Suspect if there was a splinter or sliver of toothpick in the foot it would be able to come out on its own. For now I recommend controlling pain and allowing a few days for area to heal. Applied LET gel in clinic for topical numbing. Recommend ibu and/or tylenol, ice if needed, and avoiding pressure on area. Provided with foot specialist for follow up if still bothering him. Patient agreeable to plan  Final Clinical Impressions(s) / UC Diagnoses   Final diagnoses:  Foot injury, right, initial encounter     Discharge Instructions      The gel I applied to your foot can be removed after about 30 minutes. This should help to numb the area and reduce pain for a little while.   I also recommend using ibuprofen and/or tylenol every 6 hours for pain  You can try ice for 10-15 minutes at a time, but not directly on the skin (use towel or cloth between)  It may take a few days for pain to improve and soreness to resolve. If there was any splinter in the foot it should come out on its own  Please call the foot specialist for follow up if needed     ED Prescriptions     Medication Sig Dispense Auth. Provider   ibuprofen (ADVIL) 600 MG tablet Take 1 tablet (600 mg total) by mouth every 6 (six) hours as needed. 30 tablet Bolden Hagerman, Lurena Joiner, PA-C      PDMP not reviewed this encounter.   Kathrine Haddock 08/22/22 1130

## 2022-08-22 NOTE — Discharge Instructions (Addendum)
The gel I applied to your foot can be removed after about 30 minutes. This should help to numb the area and reduce pain for a little while.   I also recommend using ibuprofen and/or tylenol every 6 hours for pain  You can try ice for 10-15 minutes at a time, but not directly on the skin (use towel or cloth between)  It may take a few days for pain to improve and soreness to resolve. If there was any splinter in the foot it should come out on its own  Please call the foot specialist for follow up if needed

## 2022-08-22 NOTE — ED Triage Notes (Signed)
Patient reports stepping on a toothpick.  Unsure if piece of toothpick remains in right foot.  Puncture wound in ball of right foot, base of toe next to great toe

## 2022-11-22 ENCOUNTER — Other Ambulatory Visit: Payer: Self-pay

## 2022-11-22 DIAGNOSIS — Z125 Encounter for screening for malignant neoplasm of prostate: Secondary | ICD-10-CM

## 2022-11-22 DIAGNOSIS — Z1211 Encounter for screening for malignant neoplasm of colon: Secondary | ICD-10-CM

## 2022-11-26 ENCOUNTER — Other Ambulatory Visit: Payer: Self-pay | Admitting: *Deleted

## 2022-11-26 ENCOUNTER — Other Ambulatory Visit: Payer: BC Managed Care – PPO | Admitting: *Deleted

## 2022-11-26 DIAGNOSIS — Z1211 Encounter for screening for malignant neoplasm of colon: Secondary | ICD-10-CM

## 2022-11-26 DIAGNOSIS — Z125 Encounter for screening for malignant neoplasm of prostate: Secondary | ICD-10-CM

## 2022-11-26 NOTE — Progress Notes (Signed)
See flowsheet for health history. FIT Test given to patient to complete at home.

## 2022-11-26 NOTE — Progress Notes (Signed)
Patient: Zachary Hester           Date of Birth: [birth year]           MRN: [id number] Visit Date: 11/26/2022 PCP: Hoy Register, MD  Prostate Cancer Screening Date of last physical exam:  (A year ago) Date of last rectal exam:  (None) Have you ever had any of the following?: Enlarged prostate, Prostate infection Have you ever had or been told you have an allergy to latex products?: No Are you currently taking any natural prostate preparations?: No Are you currently experiencing any urinary symptoms?: Yes If yes, please explain::  (History of BPH and elevated PSA 5.8 currently takes Flomax follow up appointment scheduled with Dr. Alvis Lemmings in November 2024.)  Prostate Exam Exam not completed. PSA blood test only completed.  Patient's History Patient Active Problem List   Diagnosis Date Noted   Prediabetes 08/19/2020   Normocytic anemia 08/19/2020   Chronic pain syndrome 09/23/2017   Spinal stenosis at L4-L5 level 06/27/2017   Chronic bilateral low back pain with left-sided sciatica 06/28/2016   Hypercholesteremia 06/21/2016   Vitamin D deficiency 06/21/2016   Heartburn 06/21/2016   Heme positive stool 06/15/2016   Generalized abdominal pain 06/15/2016   Chronic back pain    Past Medical History:  Diagnosis Date   Back pain    Chronic back pain    HTN (hypertension)    Hypercholesteremia     Family History  Problem Relation Age of Onset   Hypertension Mother    Heart disease Sister    Diabetes Sister     Social History   Occupational History   Occupation: [employer]  Tobacco Use   Smoking status: Never   Smokeless tobacco: Never  Vaping Use   Vaping status: Never Used  Substance and Sexual Activity   Alcohol use: No   Drug use: No   Sexual activity: Yes

## 2022-11-27 LAB — PSA: Prostate Specific Ag, Serum: 4.8 ng/mL — ABNORMAL HIGH (ref 0.0–4.0)

## 2022-12-02 ENCOUNTER — Telehealth: Payer: Self-pay

## 2022-12-02 NOTE — Telephone Encounter (Signed)
Attempted to contact patient regarding lab results (PSA), not able leave a message, voicemail box not setup.

## 2022-12-03 LAB — FECAL OCCULT BLOOD, IMMUNOCHEMICAL: Fecal Occult Bld: NEGATIVE

## 2022-12-15 ENCOUNTER — Other Ambulatory Visit: Payer: Self-pay

## 2022-12-15 ENCOUNTER — Telehealth: Payer: Self-pay | Admitting: *Deleted

## 2022-12-15 ENCOUNTER — Telehealth: Payer: Self-pay

## 2022-12-15 ENCOUNTER — Other Ambulatory Visit: Payer: Self-pay | Admitting: Family Medicine

## 2022-12-15 DIAGNOSIS — R399 Unspecified symptoms and signs involving the genitourinary system: Secondary | ICD-10-CM

## 2022-12-15 NOTE — Telephone Encounter (Signed)
  Chief Complaint: Results Symptoms: NA Frequency: NA Pertinent Negatives: Patient denies NA Disposition: [] ED /[] Urgent Care (no appt availability in office) / [] Appointment(In office/virtual)/ []  [facility]/ [] Home Care/ [] Refused Recommended Disposition /[] [facility]/ [x]  Follow-up with PCP Additional Notes:   Sherri with Outreach calling to reports pt's PSA 4.8. Also pt reports not taking his meds. Last OV 09/21/21. Please advise.

## 2022-12-15 NOTE — Telephone Encounter (Signed)
Patient informed FIT Test results are negative, PSA results are elevated at 4.8 but decreased from 5.8 09/2021,  informed needs to follow-up with pcp, doing well otherwise, but did state at appointment that he was taking Tamsulosin, but is no longer taking.  I contacted Zachary Hester, Triage Nurse at [facility] (Dr. Alvis Lemmings), and informed test results, she will forward information to Dr. Alvis Lemmings, and have patient scheduled for follow-up.

## 2022-12-15 NOTE — Telephone Encounter (Signed)
Spoke with patient . Patient voiced that he could not afford medication. Call placed to [facility]CP , they were able to assist patient with medication. Patient is aware and will stop by pharmacy today.

## 2022-12-16 ENCOUNTER — Other Ambulatory Visit: Payer: Self-pay

## 2022-12-16 ENCOUNTER — Telehealth: Payer: Self-pay | Admitting: Family Medicine

## 2022-12-16 DIAGNOSIS — E7800 Pure hypercholesterolemia, unspecified: Secondary | ICD-10-CM

## 2022-12-16 MED ORDER — ATORVASTATIN CALCIUM 40 MG PO TABS
40.0000 mg | ORAL_TABLET | Freq: Every day | ORAL | 1 refills | Status: DC
  Filled 2022-12-16: qty 30, 30d supply, fill #0
  Filled 2023-01-17: qty 30, 30d supply, fill #1
  Filled 2023-02-23: qty 30, 30d supply, fill #2
  Filled 2023-04-01: qty 30, 30d supply, fill #3
  Filled 2023-05-10: qty 30, 30d supply, fill #4
  Filled 2023-06-17 – 2023-07-09 (×2): qty 30, 30d supply, fill #0

## 2022-12-16 NOTE — Telephone Encounter (Signed)
Pt states PCP advised to start back on cholesterol med. Does not know name, not on current med profile. Did note Arorvastatin on past med list Please advise. Thank you

## 2022-12-16 NOTE — Telephone Encounter (Signed)
Medication Refill -  Most Recent Primary Care Visit:  Provider: Hoy Register  Department: [facility]  Visit Type: PHYSICAL  Date: 09/21/2021  Medication:  Medication for chloresterol but don't know name of medication, don't have bottle with him   *been out for 2 or 3 months and provider told him that he needs it again   Has the patient contacted their pharmacy? No  Is this the correct pharmacy for this prescription? Yes Pharmacy is correct pharmacy below If no, delete pharmacy and type the correct one.  This is the patient's preferred pharmacy: [facility]  Phone: [phone] Fax: [phone]   Has the prescription been filled recently? No?  Is the patient out of the medication? Yes, patient states he has been out for 2 or 3 months now but states his PCP told him that he needs this medication again  Has the patient been seen for an appointment in the last year OR does the patient have an upcoming appointment? Yes.  F/U with PCP on 01/05/2023

## 2022-12-16 NOTE — Telephone Encounter (Signed)
Atorvastatin sent to the pharmacy for patient. Patient is aware.

## 2022-12-20 ENCOUNTER — Other Ambulatory Visit: Payer: Self-pay

## 2023-01-05 ENCOUNTER — Other Ambulatory Visit: Payer: Self-pay

## 2023-01-05 ENCOUNTER — Ambulatory Visit: Payer: BC Managed Care – PPO | Attending: Family Medicine | Admitting: Family Medicine

## 2023-01-05 ENCOUNTER — Encounter: Payer: Self-pay | Admitting: Family Medicine

## 2023-01-05 VITALS — BP 147/87 | HR 77 | Ht 74.0 in | Wt 208.0 lb

## 2023-01-05 DIAGNOSIS — Z131 Encounter for screening for diabetes mellitus: Secondary | ICD-10-CM | POA: Diagnosis not present

## 2023-01-05 DIAGNOSIS — R399 Unspecified symptoms and signs involving the genitourinary system: Secondary | ICD-10-CM

## 2023-01-05 DIAGNOSIS — I10 Essential (primary) hypertension: Secondary | ICD-10-CM | POA: Diagnosis not present

## 2023-01-05 DIAGNOSIS — R972 Elevated prostate specific antigen [PSA]: Secondary | ICD-10-CM

## 2023-01-05 DIAGNOSIS — R7303 Prediabetes: Secondary | ICD-10-CM | POA: Diagnosis not present

## 2023-01-05 LAB — POCT GLYCOSYLATED HEMOGLOBIN (HGB A1C): HbA1c, POC (controlled diabetic range): 5.9 % (ref 0.0–7.0)

## 2023-01-05 MED ORDER — AMLODIPINE BESYLATE 2.5 MG PO TABS
2.5000 mg | ORAL_TABLET | Freq: Every day | ORAL | 1 refills | Status: DC
  Filled 2023-01-05: qty 30, 30d supply, fill #0
  Filled 2023-02-23: qty 30, 30d supply, fill #1
  Filled 2023-04-01: qty 30, 30d supply, fill #2
  Filled 2023-05-10: qty 30, 30d supply, fill #3
  Filled 2023-06-17 – 2023-07-09 (×2): qty 30, 30d supply, fill #0
  Filled 2023-08-16 (×2): qty 30, 30d supply, fill #1

## 2023-01-05 NOTE — Progress Notes (Signed)
Subjective:  Patient ID: Zachary Hester, male    DOB: November 14, 1960  Age: 62 y.o. MRN: [id number]  CC: Medical Management of Chronic Issues   HPI Zachary Hester is a 62 y.o. year old male with a history of low back pain from spinal stenosis, hyperlipidemia who presents today for a follow-up visit.   Interval History: Discussed the use of AI scribe software for clinical note transcription with the patient, who gave verbal consent to proceed.   He presents with elevated blood pressure but does not have a diagnosis of hypertension.  He previously had an elevated blood pressure 4 months ago and is not on medication. He has been adherent to his atorvastatin for cholesterol management. He reports no current concerns. He did have a recent colon cancer screening which was negative. He also had a PSA test for prostate cancer screening which was slightly elevated at 4.8 but this has decreased from 5.8 previously. He has been taking tamsulosin for BPH. He also reports no current back pain, which is a significant improvement in his previously diagnosed spinal stenosis.        Past Medical History:  Diagnosis Date   Back pain    Chronic back pain    HTN (hypertension)    Hypercholesteremia     Past Surgical History:  Procedure Laterality Date   NO PAST SURGERIES      Family History  Problem Relation Age of Onset   Hypertension Mother    Heart disease Sister    Diabetes Sister     Social History   Socioeconomic History   Marital status: Married    Spouse name: Not on file   Number of children: 6   Years of education: Not on file   Highest education level: Not on file  Occupational History   Occupation: [employer]  Tobacco Use   Smoking status: Never   Smokeless tobacco: Never  Vaping Use   Vaping status: Never Used  Substance and Sexual Activity   Alcohol use: No   Drug use: No   Sexual activity: Yes  Other Topics Concern   Not on file  Social History Narrative    Not on file   Social Determinants of Health   Financial Resource Strain: Low Risk  (06/21/2018)   Overall Financial Resource Strain (CARDIA)    Difficulty of Paying Living Expenses: Not hard at all  Food Insecurity: No Food Insecurity (06/21/2018)   Hunger Vital Sign    Worried About Running Out of Food in the Last Year: Never true    Ran Out of Food in the Last Year: Never true  Transportation Needs: No Transportation Needs (06/21/2018)   PRAPARE - Administrator, Civil Service (Medical): No    Lack of Transportation (Non-Medical): No  Physical Activity: Inactive (06/21/2018)   Exercise Vital Sign    Days of Exercise per Week: 0 days    Minutes of Exercise per Session: 0 min  Stress: No Stress Concern Present (06/21/2018)   [employer] of Occupational Health - Occupational Stress Questionnaire    Feeling of Stress : Not at all  Social Connections: Unknown (06/19/2021)   Received from [facility]   Social Network    Social Network: Not on file    No Known Allergies  Outpatient Medications Prior to Visit  Medication Sig Dispense Refill   atorvastatin (LIPITOR) 40 MG tablet Take 1 tablet (40 mg total) by mouth daily. 90 tablet 1  ibuprofen (ADVIL) 600 MG tablet Take 1 tablet (600 mg total) by mouth every 6 (six) hours as needed. 30 tablet 0   tamsulosin (FLOMAX) 0.4 MG CAPS capsule Take 1 capsule (0.4 mg total) by mouth daily. 90 capsule 1   No facility-administered medications prior to visit.     ROS Review of Systems  Constitutional:  Negative for activity change and appetite change.  HENT:  Negative for sinus pressure and sore throat.   Respiratory:  Negative for chest tightness, shortness of breath and wheezing.   Cardiovascular:  Negative for chest pain and palpitations.  Gastrointestinal:  Negative for abdominal distention, abdominal pain and constipation.  Genitourinary: Negative.   Musculoskeletal: Negative.   Psychiatric/Behavioral:   Negative for behavioral problems and dysphoric mood.     Objective:  BP (!) 147/87   Pulse 77   Ht 6\' 2"  (1.88 m)   Wt 208 lb (94.3 kg)   SpO2 99%   BMI 26.71 kg/m      01/05/2023    4:40 PM 01/05/2023    4:06 PM 08/22/2022   10:51 AM  BP/Weight  Systolic BP 147 157 151  Diastolic BP 87 77 87  Wt. (Lbs)  208   BMI  26.71 kg/m2       Physical Exam Constitutional:      Appearance: He is well-developed.  Cardiovascular:     Rate and Rhythm: Normal rate.     Heart sounds: Normal heart sounds. No murmur heard. Pulmonary:     Effort: Pulmonary effort is normal.     Breath sounds: Normal breath sounds. No wheezing or rales.  Chest:     Chest wall: No tenderness.  Abdominal:     General: Bowel sounds are normal. There is no distension.     Palpations: Abdomen is soft. There is no mass.     Tenderness: There is no abdominal tenderness.  Musculoskeletal:        General: Normal range of motion.     Right lower leg: No edema.     Left lower leg: No edema.  Neurological:     Mental Status: He is alert and oriented to person, place, and time.  Psychiatric:        Mood and Affect: Mood normal.        Latest Ref Rng & Units 09/21/2021    3:52 PM 08/22/2020    2:08 PM 08/14/2020    2:52 PM  CMP  Glucose 70 - 99 mg/dL 91   69   BUN 8 - 27 mg/dL 14   10   Creatinine 2.95 - 1.27 mg/dL 2.84   1.32   Sodium 440 - 144 mmol/L 140   133   Potassium 3.5 - 5.2 mmol/L 3.9  4.3  5.5   Chloride 96 - 106 mmol/L 105   95   CO2 20 - 29 mmol/L 20   19   Calcium 8.6 - 10.2 mg/dL 9.7   9.6   Total Protein 6.0 - 8.5 g/dL 7.0   8.2   Total Bilirubin 0.0 - 1.2 mg/dL 0.5   0.6   Alkaline Phos 44 - 121 IU/L 114   229   AST 0 - 40 IU/L 17   47   ALT 0 - 44 IU/L 13   23     Lipid Panel     Component Value Date/Time   CHOL 251 (H) 09/21/2021 1552   TRIG 129 09/21/2021 1552   HDL 48 09/21/2021 1552  CHOLHDL 4.8 08/14/2020 1452   CHOLHDL 5.3 Ratio 11/05/2009 2215   VLDL 30 11/05/2009  2215   LDLCALC 180 (H) 09/21/2021 1552    CBC    Component Value Date/Time   WBC 7.2 09/21/2021 1552   WBC 6.2 09/01/2020 1159   RBC 5.53 09/21/2021 1552   RBC 5.35 09/01/2020 1200   RBC 5.46 09/01/2020 1159   HGB 16.1 09/21/2021 1552   HCT 47.6 09/21/2021 1552   PLT 283 09/21/2021 1552   MCV 86 09/21/2021 1552   MCH 29.1 09/21/2021 1552   MCH 29.3 09/01/2020 1159   MCHC 33.8 09/21/2021 1552   MCHC 34.7 09/01/2020 1159   RDW 13.1 09/21/2021 1552   LYMPHSABS 2.1 09/21/2021 1552   MONOABS 0.4 09/01/2020 1159   EOSABS 0.4 09/21/2021 1552   BASOSABS 0.1 09/21/2021 1552    Lab Results  Component Value Date   HGBA1C 5.9 01/05/2023    Assessment & Plan:      Hypertension Elevated blood pressure readings on multiple occasions. Discussed the need for antihypertensive medication. -Start Amlodipine 2.5mg  daily. -Advise low sodium diet and regular exercise.  Hyperlipidemia On Atorvastatin, but last cholesterol check a year ago was elevated. -Check lipid panel today as patient is fasting. -Low-cholesterol diet  Prediabetes A1c 5.9, discussed dietary modifications to prevent progression to diabetes. -Advise low starch diet, increase fruits and vegetables.  Elevated PSA PSA slightly elevated but decreasing (5.8 to 4.8). -Plan to recheck PSA in 6 months.  Spinal Stenosis No current back pain, patient avoiding heavy lifting. -Continue current management.  Lower urinary tract symptoms Doing well on tamsulosin          Meds ordered this encounter  Medications   amLODipine (NORVASC) 2.5 MG tablet    Sig: Take 1 tablet (2.5 mg total) by mouth daily.    Dispense:  90 tablet    Refill:  1    Follow-up: Return in about 6 months (around 07/05/2023) for Chronic medical conditions.       Hoy Register, MD, FAAFP. [facility] [place], [place] [phone]   01/05/2023, 4:47 PM

## 2023-01-05 NOTE — Patient Instructions (Signed)
VISIT SUMMARY:  During today's visit, we reviewed your history of hyperlipidemia, prediabetes, and elevated blood pressure. You reported no current concerns and mentioned a recent negative colon cancer screening and a slightly elevated PSA test. We discussed your current medications and made some adjustments to better manage your health conditions.  YOUR PLAN:  -HYPERTENSION: Hypertension means you have high blood pressure. We will start you on Amlodipine 2.5mg  daily to help lower your blood pressure. Additionally, please follow a low sodium diet and engage in regular exercise.  -HYPERLIPIDEMIA: Hyperlipidemia means you have high levels of cholesterol in your blood. You are currently taking Atorvastatin, but we need to check your cholesterol levels today since it has been a year since your last test.  -PREDIABETES: Prediabetes means your blood sugar levels are higher than normal but not high enough to be classified as diabetes. Your A1c is 5.9. To prevent progression to diabetes, please follow a low starch diet and increase your intake of fruits and vegetables.  -PROSTATE CANCER SCREENING: Your PSA levels, which help screen for prostate cancer, are slightly elevated but have decreased from 5.8 to 4.8. We will recheck your PSA in 6 months to monitor any changes.  -SPINAL STENOSIS: Spinal stenosis is a narrowing of the spaces within your spine, which can put pressure on the nerves. You reported no current back pain, which is good. Continue to avoid heavy lifting and follow your current management plan.  -GENERAL HEALTH MAINTENANCE: Continue taking Tamsulosin for prostate health. Ensure you have refills for your current medications. Maintain a healthy lifestyle with a balanced diet and regular exercise.  INSTRUCTIONS:  Please schedule a follow-up visit in 6 months to recheck your PSA levels and assess your blood pressure control. Also, get your lipid panel checked today as you are fasting.

## 2023-01-06 LAB — LP+NON-HDL CHOLESTEROL
Cholesterol, Total: 156 mg/dL (ref 100–199)
HDL: 48 mg/dL (ref >39)
LDL Chol Calc (NIH): 90 mg/dL (ref 0–99)
Total Non-HDL-Chol (LDL+VLDL): 108 mg/dL (ref 0–129)
Triglycerides: 97 mg/dL (ref 0–149)
VLDL Cholesterol Cal: 18 mg/dL (ref 5–40)

## 2023-01-06 LAB — CMP14+EGFR
ALT: 18 [IU]/L (ref 0–44)
AST: 19 [IU]/L (ref 0–40)
Albumin: 4.1 g/dL (ref 3.9–4.9)
Alkaline Phosphatase: 118 [IU]/L (ref 44–121)
BUN/Creatinine Ratio: 15 (ref 10–24)
BUN: 11 mg/dL (ref 8–27)
Bilirubin Total: 0.8 mg/dL (ref 0.0–1.2)
CO2: 20 mmol/L (ref 20–29)
Calcium: 9.5 mg/dL (ref 8.6–10.2)
Chloride: 103 mmol/L (ref 96–106)
Creatinine, Ser: 0.75 mg/dL — ABNORMAL LOW (ref 0.76–1.27)
Globulin, Total: 2.6 g/dL (ref 1.5–4.5)
Glucose: 82 mg/dL (ref 70–99)
Potassium: 4.2 mmol/L (ref 3.5–5.2)
Sodium: 138 mmol/L (ref 134–144)
Total Protein: 6.7 g/dL (ref 6.0–8.5)
eGFR: 102 mL/min/{1.73_m2} (ref >59)

## 2023-01-06 LAB — CBC WITH DIFFERENTIAL/PLATELET
Basophils Absolute: 0.1 10*3/uL (ref 0.0–0.2)
Basos: 2 %
EOS (ABSOLUTE): 0.2 10*3/uL (ref 0.0–0.4)
Eos: 3 %
Hematocrit: 48 % (ref 37.5–51.0)
Hemoglobin: 15.9 g/dL (ref 13.0–17.7)
Immature Grans (Abs): 0 10*3/uL (ref 0.0–0.1)
Immature Granulocytes: 0 %
Lymphocytes Absolute: 2.4 10*3/uL (ref 0.7–3.1)
Lymphs: 36 %
MCH: 28.9 pg (ref 26.6–33.0)
MCHC: 33.1 g/dL (ref 31.5–35.7)
MCV: 87 fL (ref 79–97)
Monocytes Absolute: 0.5 10*3/uL (ref 0.1–0.9)
Monocytes: 7 %
Neutrophils Absolute: 3.5 10*3/uL (ref 1.4–7.0)
Neutrophils: 52 %
Platelets: 248 10*3/uL (ref 150–450)
RBC: 5.51 x10E6/uL (ref 4.14–5.80)
RDW: 13 % (ref 11.6–15.4)
WBC: 6.7 10*3/uL (ref 3.4–10.8)

## 2023-01-17 ENCOUNTER — Other Ambulatory Visit: Payer: Self-pay

## 2023-02-24 ENCOUNTER — Other Ambulatory Visit: Payer: Self-pay

## 2023-04-01 ENCOUNTER — Other Ambulatory Visit: Payer: Self-pay

## 2023-04-30 ENCOUNTER — Other Ambulatory Visit (HOSPITAL_COMMUNITY): Payer: Self-pay

## 2023-05-10 ENCOUNTER — Other Ambulatory Visit: Payer: Self-pay

## 2023-06-17 ENCOUNTER — Other Ambulatory Visit (HOSPITAL_COMMUNITY): Payer: Self-pay

## 2023-06-17 ENCOUNTER — Other Ambulatory Visit: Payer: Self-pay

## 2023-06-30 ENCOUNTER — Other Ambulatory Visit (HOSPITAL_COMMUNITY): Payer: Self-pay

## 2023-07-09 ENCOUNTER — Other Ambulatory Visit (HOSPITAL_COMMUNITY): Payer: Self-pay

## 2023-08-16 ENCOUNTER — Other Ambulatory Visit: Payer: Self-pay

## 2023-08-16 ENCOUNTER — Other Ambulatory Visit: Payer: Self-pay | Admitting: Family Medicine

## 2023-08-16 ENCOUNTER — Other Ambulatory Visit (HOSPITAL_COMMUNITY): Payer: Self-pay

## 2023-08-16 DIAGNOSIS — E7800 Pure hypercholesterolemia, unspecified: Secondary | ICD-10-CM

## 2023-08-16 NOTE — Telephone Encounter (Signed)
 Copied from CRM [id number]. Topic: Clinical - Medication Refill >> Aug 16, 2023  4:41 PM Turkey B wrote: Medication:  Atorvastatin  Calcium  40 mg Pt now has an appt scheduled for 08/12  Has the patient contacted their pharmacy? yes (Agent: If yes, when and what did the pharmacy advise?)contact pcp  This is the patient's preferred pharmacy:  [facility] [address] [place], [place], [place] [address] Phone: [phone]   [phone]  fx  Is this the correct pharmacy for this prescription? yes If no, delete pharmacy and type the correct one.   Has the prescription been filled recently? no  Is the patient out of the medication? yes  Has the patient been seen for an appointment in the last year OR does the patient have an upcoming appointment? yes  Can we respond through MyChart? yes  Agent: Please be advised that Rx refills may take up to 3 business days. We ask that you follow-up with your pharmacy.

## 2023-08-17 NOTE — Telephone Encounter (Signed)
 Copied from CRM [id number]. Topic: Clinical - Medication Refill >> Aug 16, 2023  4:41 PM Turkey B wrote: Medication:  Atorvastatin  Calcium  40 mg Pt now has an appt scheduled for 08/12  Has the patient contacted their pharmacy? yes (Agent: If yes, when and what did the pharmacy advise?)contact pcp  This is the patient's preferred pharmacy:  [facility] [address] [place], [place], [place] [address] Phone: [phone]  Is this the correct pharmacy for this prescription? yes If no, delete pharmacy and type the correct one.   Has the prescription been filled recently? no  Is the patient out of the medication? yes  Has the patient been seen for an appointment in the last year OR does the patient have an upcoming appointment? yes  Can we respond through MyChart? yes  Agent: Please be advised that Rx refills may take up to 3 business days. We ask that you follow-up with your pharmacy. >> Aug 17, 2023  1:47 PM Emylou G wrote: Pls call patient.SABRA trying to refill but the soonest appt is August?  Can anything be done.

## 2023-08-17 NOTE — Telephone Encounter (Addendum)
 Called patient, patient has been scheduled to the the next available appointment 08/29/2023 at 9:10 am with Dr. Newlin. Patient acknowledged appointment.

## 2023-08-18 ENCOUNTER — Other Ambulatory Visit: Payer: Self-pay

## 2023-08-18 MED ORDER — ATORVASTATIN CALCIUM 40 MG PO TABS
40.0000 mg | ORAL_TABLET | Freq: Every day | ORAL | 0 refills | Status: DC
  Filled 2023-08-18: qty 30, 30d supply, fill #0

## 2023-08-18 NOTE — Telephone Encounter (Signed)
 Courtesy refill given, appointment needed.   Requested Prescriptions  Pending Prescriptions Disp Refills   atorvastatin  (LIPITOR) 40 MG tablet 30 tablet 0    Sig: Take 1 tablet (40 mg total) by mouth daily. OFFICE VISIT NEEDED FOR ADDITIONAL REFILLS     Cardiovascular:  Antilipid - Statins Failed - 08/18/2023  4:29 PM      Failed - Lipid Panel in normal range within the last 12 months    Cholesterol, Total  Date Value Ref Range Status  01/05/2023 156 100 - 199 mg/dL Final   LDL Chol Calc (NIH)  Date Value Ref Range Status  01/05/2023 90 0 - 99 mg/dL Final   HDL  Date Value Ref Range Status  01/05/2023 48 >39 mg/dL Final   Triglycerides  Date Value Ref Range Status  01/05/2023 97 0 - 149 mg/dL Final         Passed - Patient is not pregnant      Passed - Valid encounter within last 12 months    Recent Outpatient Visits           7 months ago Screening for diabetes mellitus   [facility] [place]. [facility] Delbert Clam, MD   1 year ago Hypercholesteremia   [facility] [place]. [facility] Delbert Clam, MD   3 years ago Diabetes mellitus screening   [facility] [place]. [facility] Barnie NOVAK, MD   4 years ago Spinal stenosis at L4-L5 level   [facility] [place]. [facility] Delbert Clam, MD   5 years ago Encounter to establish care   [facility], Charlie, NP

## 2023-08-26 ENCOUNTER — Telehealth: Payer: Self-pay | Admitting: Family Medicine

## 2023-08-26 NOTE — Telephone Encounter (Signed)
Contacted pt confirmed appt

## 2023-08-29 ENCOUNTER — Encounter: Payer: Self-pay | Admitting: Family Medicine

## 2023-08-29 ENCOUNTER — Ambulatory Visit: Attending: Family Medicine | Admitting: Family Medicine

## 2023-08-29 ENCOUNTER — Other Ambulatory Visit (HOSPITAL_COMMUNITY): Payer: Self-pay

## 2023-08-29 ENCOUNTER — Other Ambulatory Visit: Payer: Self-pay

## 2023-08-29 VITALS — BP 145/70 | HR 72 | Ht 74.0 in | Wt 216.6 lb

## 2023-08-29 DIAGNOSIS — I10 Essential (primary) hypertension: Secondary | ICD-10-CM | POA: Diagnosis not present

## 2023-08-29 DIAGNOSIS — E7800 Pure hypercholesterolemia, unspecified: Secondary | ICD-10-CM

## 2023-08-29 DIAGNOSIS — M25375 Other instability, left foot: Secondary | ICD-10-CM

## 2023-08-29 DIAGNOSIS — R5383 Other fatigue: Secondary | ICD-10-CM

## 2023-08-29 DIAGNOSIS — R7303 Prediabetes: Secondary | ICD-10-CM

## 2023-08-29 DIAGNOSIS — R972 Elevated prostate specific antigen [PSA]: Secondary | ICD-10-CM | POA: Diagnosis not present

## 2023-08-29 DIAGNOSIS — R399 Unspecified symptoms and signs involving the genitourinary system: Secondary | ICD-10-CM

## 2023-08-29 MED ORDER — ATORVASTATIN CALCIUM 40 MG PO TABS
40.0000 mg | ORAL_TABLET | Freq: Every day | ORAL | 1 refills | Status: AC
  Filled 2023-08-29 (×3): qty 30, 30d supply, fill #0
  Filled 2023-10-04: qty 30, 30d supply, fill #1
  Filled 2023-10-24 – 2023-11-05 (×3): qty 30, 30d supply, fill #2
  Filled 2023-12-07 (×2): qty 30, 30d supply, fill #3
  Filled 2024-01-04: qty 30, 30d supply, fill #4
  Filled 2024-02-14 (×2): qty 30, 30d supply, fill #5

## 2023-08-29 MED ORDER — AMLODIPINE BESYLATE 2.5 MG PO TABS
2.5000 mg | ORAL_TABLET | Freq: Every day | ORAL | 1 refills | Status: DC
  Filled 2023-08-29 – 2023-10-04 (×3): qty 30, 30d supply, fill #0
  Filled 2023-10-24 – 2023-11-05 (×3): qty 30, 30d supply, fill #1
  Filled 2023-12-07 (×2): qty 30, 30d supply, fill #2
  Filled 2024-01-04: qty 30, 30d supply, fill #3
  Filled 2024-02-14 (×2): qty 30, 30d supply, fill #4

## 2023-08-29 MED ORDER — TAMSULOSIN HCL 0.4 MG PO CAPS
0.4000 mg | ORAL_CAPSULE | Freq: Every day | ORAL | 1 refills | Status: AC
  Filled 2023-08-29: qty 30, 30d supply, fill #0

## 2023-08-29 NOTE — Patient Instructions (Signed)
 VISIT SUMMARY:  You came in today because you have been experiencing difficulty holding urine, along with other concerns such as fatigue, left foot weakness, and a lapse in your medications for blood pressure, cholesterol, and prostate health. We discussed the importance of taking your medications consistently and addressed your concerns with a plan to follow up on each issue.  YOUR PLAN:  -LOWER URINARY TRACT SYMPTOMS DUE TO BENIGN PROSTATIC HYPERPLASIA: This condition is caused by an enlarged prostate, which can lead to difficulty with urination. It is important to take your tamsulosin  medication daily to help manage these symptoms. We have refilled your prescription for tamsulosin  0.4 mg to be taken once daily for 90 days. Additionally, we will check your PSA levels to monitor your prostate health.  -ELEVATED PSA: An elevated PSA can be a sign of prostate issues, including cancer. Since your PSA was previously elevated and you haven't seen a specialist since 2019, we will recheck your PSA levels. If they remain elevated, we will refer you to a urologist.  -HYPERTENSION: High blood pressure can lead to serious health issues if not managed properly. Your blood pressure is elevated due to a lapse in your medication. We have refilled your blood pressure medication and recommend follow-up appointments every six months to manage this condition.  -HYPERLIPIDEMIA: This condition means you have high cholesterol levels, which can increase your risk of heart disease. It is important to take your atorvastatin  medication daily. We have refilled your prescription for atorvastatin  40 mg to be taken once daily.  -PREDIABETES: Prediabetes means your blood sugar levels are higher than normal but not high enough to be classified as diabetes. We will screen for diabetes with a blood test to ensure proper management.  -FATIGUE: Feeling unusually tired can have several causes, including vitamin D  deficiency, thyroid   issues, anemia, or low testosterone . We will investigate these potential causes with blood tests.  -LEFT FOOT WEAKNESS AND INSTABILITY: Your left foot weakness and instability may be due to nerve damage from a previous fracture. We discussed the use of a brace and have referred you to a podiatrist for further evaluation and potential bracing.  INSTRUCTIONS:  Please make sure to take your medications as prescribed and attend all follow-up appointments. We will recheck your PSA levels and screen for diabetes. Additionally, we will conduct blood tests to investigate the cause of your fatigue. Follow up with the podiatrist for your left foot weakness and instability. Schedule a follow-up appointment in six months to monitor your blood pressure and overall health.

## 2023-08-29 NOTE — Progress Notes (Signed)
 Subjective:  Patient ID: Zachary Hester, male    DOB: [birth year]  Age: 63 y.o. MRN: [id number]  CC: Medical Management of Chronic Issues (Can't hold urine)     Discussed the use of AI scribe software for clinical note transcription with the patient, who gave verbal consent to proceed.  History of Present Illness Zachary Hester is a 63 year old male  with a history of low back pain from spinal stenosis, hyperlipidemia, BPH who presents with difficulty holding urine.  He experiences urinary urgency and difficulty holding urine for six months to a year. He has a prescription for tamsulosin  but has not been taking it consistently due to not refilling the prescription. He is also out of his blood pressure, cholesterol, and prostate medications due to [place].  He reports weakness and numbness in his left foot, which he attributes to a previous fracture.  While walking he feels his left foot not going in the direction he would like.  He has difficulty feeling his foot, occasionally losing his shoe without noticing. A foot specialist has not identified a definitive cause.  He feels tired without apparent reason, even without physical activity. He is interested in diabetes screening. He had an elevated PSA test of  4.8 in October and has not seen a prostate specialist since 2019.    Past Medical History:  Diagnosis Date   Back pain    Chronic back pain    HTN (hypertension)    Hypercholesteremia     Past Surgical History:  Procedure Laterality Date   NO PAST SURGERIES      Family History  Problem Relation Age of Onset   Hypertension Mother    Heart disease Sister    Diabetes Sister     Social History   Socioeconomic History   Marital status: Married    Spouse name: Not on file   Number of children: 6   Years of education: Not on file   Highest education level: Not on file  Occupational History   Occupation: [employer]   Tobacco Use   Smoking status: Never   Smokeless tobacco: Never  Vaping Use   Vaping status: Never Used  Substance and Sexual Activity   Alcohol  use: No   Drug use: No   Sexual activity: Yes  Other Topics Concern   Not on file  Social History Narrative   Not on file   Social Drivers of Health   Financial Resource Strain: Medium Risk (08/29/2023)   Overall Financial Resource Strain (CARDIA)    Difficulty of Paying Living Expenses: Somewhat hard  Food Insecurity: Food Insecurity Present (08/29/2023)   Hunger Vital Sign    Worried About Running Out of Food in the Last Year: Sometimes true    Ran Out of Food in the Last Year: Sometimes true  Transportation Needs: No Transportation Needs (08/29/2023)   PRAPARE - Administrator, Civil Service (Medical): No    Lack of Transportation (Non-Medical): No  Physical Activity: Inactive (08/29/2023)   Exercise Vital Sign    Days of Exercise per Week: 0 days    Minutes of Exercise per Session: 0 min  Stress: Stress Concern Present (08/29/2023)   [employer] of Occupational Health - Occupational Stress Questionnaire    Feeling of Stress: To some extent  Social Connections: Socially Integrated (08/29/2023)   Social Connection and Isolation Panel    Frequency of Communication with Friends and Family: More  than three times a week    Frequency of Social Gatherings with Friends and Family: More than three times a week    Attends Religious Services: 1 to 4 times per year    Active Member of [employer] or Organizations: No    Attends Engineer, structural: More than 4 times per year    Marital Status: Married    No Known Allergies  Outpatient Medications Prior to Visit  Medication Sig Dispense Refill   ibuprofen  (ADVIL ) 600 MG tablet Take 1 tablet (600 mg total) by mouth every 6 (six) hours as needed. 30 tablet 0   amLODipine  (NORVASC ) 2.5 MG tablet Take 1 tablet (2.5 mg total) by mouth daily. 90 tablet 1   atorvastatin   (LIPITOR) 40 MG tablet Take 1 tablet (40 mg total) by mouth daily. OFFICE VISIT NEEDED FOR ADDITIONAL REFILLS 30 tablet 0   tamsulosin  (FLOMAX ) 0.4 MG CAPS capsule Take 1 capsule (0.4 mg total) by mouth daily. 90 capsule 1   No facility-administered medications prior to visit.     ROS Review of Systems  Constitutional:  Negative for activity change and appetite change.  HENT:  Negative for sinus pressure and sore throat.   Respiratory:  Negative for chest tightness, shortness of breath and wheezing.   Cardiovascular:  Negative for chest pain and palpitations.  Gastrointestinal:  Negative for abdominal distention, abdominal pain and constipation.  Genitourinary: Negative.   Musculoskeletal:        See HPI  Psychiatric/Behavioral:  Negative for behavioral problems and dysphoric mood.     Objective:  BP (!) 145/70   Pulse 72   Ht 6' 2 (1.88 m)   Wt 216 lb 9.6 oz (98.2 kg)   SpO2 97%   BMI 27.81 kg/m      08/29/2023    9:34 AM 08/29/2023    9:12 AM 01/05/2023    4:40 PM  BP/Weight  Systolic BP 145 143 147  Diastolic BP 70 84 87  Wt. (Lbs)  216.6   BMI  27.81 kg/m2       Physical Exam Constitutional:      Appearance: He is well-developed.  Cardiovascular:     Rate and Rhythm: Normal rate.     Heart sounds: Normal heart sounds. No murmur heard. Pulmonary:     Effort: Pulmonary effort is normal.     Breath sounds: Normal breath sounds. No wheezing or rales.  Chest:     Chest wall: No tenderness.  Abdominal:     General: Bowel sounds are normal. There is no distension.     Palpations: Abdomen is soft. There is no mass.     Tenderness: There is no abdominal tenderness.  Musculoskeletal:     Right lower leg: No edema.     Left lower leg: No edema.     Comments: Slight internal rotation of left foot with slight edema of lateral aspect of dorsum of left foot  Neurological:     Mental Status: He is alert and oriented to person, place, and time.  Psychiatric:         Mood and Affect: Mood normal.        Latest Ref Rng & Units 01/05/2023    4:52 PM 09/21/2021    3:52 PM 08/22/2020    2:08 PM  CMP  Glucose 70 - 99 mg/dL 82  91    BUN 8 - 27 mg/dL 11  14    Creatinine 9.23 - 1.27 mg/dL 9.24  9.04  Sodium 134 - 144 mmol/L 138  140    Potassium 3.5 - 5.2 mmol/L 4.2  3.9  4.3   Chloride 96 - 106 mmol/L 103  105    CO2 20 - 29 mmol/L 20  20    Calcium  8.6 - 10.2 mg/dL 9.5  9.7    Total Protein 6.0 - 8.5 g/dL 6.7  7.0    Total Bilirubin 0.0 - 1.2 mg/dL 0.8  0.5    Alkaline Phos 44 - 121 IU/L 118  114    AST 0 - 40 IU/L 19  17    ALT 0 - 44 IU/L 18  13      Lipid Panel     Component Value Date/Time   CHOL 156 01/05/2023 1652   TRIG 97 01/05/2023 1652   HDL 48 01/05/2023 1652   CHOLHDL 4.8 08/14/2020 1452   CHOLHDL 5.3 Ratio 11/05/2009 2215   VLDL 30 11/05/2009 2215   LDLCALC 90 01/05/2023 1652    CBC    Component Value Date/Time   WBC 6.7 01/05/2023 1652   WBC 6.2 09/01/2020 1159   RBC 5.51 01/05/2023 1652   RBC 5.35 09/01/2020 1200   RBC 5.46 09/01/2020 1159   HGB 15.9 01/05/2023 1652   HCT 48.0 01/05/2023 1652   PLT 248 01/05/2023 1652   MCV 87 01/05/2023 1652   MCH 28.9 01/05/2023 1652   MCH 29.3 09/01/2020 1159   MCHC 33.1 01/05/2023 1652   MCHC 34.7 09/01/2020 1159   RDW 13.0 01/05/2023 1652   LYMPHSABS 2.4 01/05/2023 1652   MONOABS 0.4 09/01/2020 1159   EOSABS 0.2 01/05/2023 1652   BASOSABS 0.1 01/05/2023 1652    Lab Results  Component Value Date   HGBA1C 5.9 01/05/2023       Assessment & Plan Lower Urinary Tract Symptoms due to Benign Prostatic Hyperplasia Symptoms consistent with BPH. Informed of the necessity of consistent tamsulosin  use. - Refill tamsulosin  0.4 mg oral daily for 90 days. - Instruct to take tamsulosin  daily. - Order PSA test.  Elevated PSA PSA previously elevated. No follow-up with specialist since 2019. Recheck PSA to assess need for urologist referral. - Order PSA test. - Refer to  urologist if PSA remains elevated.  Hypertension Blood pressure elevated due to medication lapse and missed appointments. Advised on regular follow-up for medication management. - Refill blood pressure medication. -Counseled on blood pressure goal of less than 130/80, low-sodium, DASH diet, medication compliance, 150 minutes of moderate intensity exercise per week. Discussed medication compliance, adverse effects. - Advise follow-up every six months.  Hyperlipidemia Not taking atorvastatin  due to running out. Informed of importance of consistent use. - Refill atorvastatin  40 mg oral daily.  Prediabetes Labs reveal prediabetes with an A1c of 5.9.  An A1c of 6.5 is supportive of a diagnosis of type 2 diabetes mellitus.  Working on a low carbohydrate diet, exercise, weight loss is recommended in order to prevent progression to type 2 diabetes mellitus. - Order blood test to screen for diabetes.  Fatigue Reports fatigue. Potential causes include vitamin D  deficiency, thyroid  dysfunction, anemia, and low testosterone . Plan to investigate with blood tests. - Order blood tests for vitamin D , thyroid  function, anemia, and testosterone  levels.  Left Foot Weakness and Instability Weakness and instability post-fracture. Suspected nerve damage. Discussed brace use and referred to podiatrist. - Refer to podiatrist for evaluation and potential bracing.    Meds ordered this encounter  Medications   amLODipine  (NORVASC ) 2.5 MG tablet  Sig: Take 1 tablet (2.5 mg total) by mouth daily.    Dispense:  90 tablet    Refill:  1   atorvastatin  (LIPITOR) 40 MG tablet    Sig: Take 1 tablet (40 mg total) by mouth daily.    Dispense:  90 tablet    Refill:  1   tamsulosin  (FLOMAX ) 0.4 MG CAPS capsule    Sig: Take 1 capsule (0.4 mg total) by mouth daily.    Dispense:  90 capsule    Refill:  1    Follow-up: Return in about 6 months (around 02/29/2024) for Chronic medical conditions.       Corrina Sabin, MD, FAAFP. [facility] [place], [place] [phone]   08/29/2023, 9:50 AM

## 2023-08-30 ENCOUNTER — Ambulatory Visit: Payer: Self-pay | Admitting: Family Medicine

## 2023-08-30 ENCOUNTER — Other Ambulatory Visit: Payer: Self-pay

## 2023-08-30 DIAGNOSIS — R972 Elevated prostate specific antigen [PSA]: Secondary | ICD-10-CM

## 2023-08-30 LAB — CMP14+EGFR
ALT: 16 IU/L (ref 0–44)
AST: 16 IU/L (ref 0–40)
Albumin: 4.1 g/dL (ref 3.9–4.9)
Alkaline Phosphatase: 116 IU/L (ref 44–121)
BUN/Creatinine Ratio: 16 (ref 10–24)
BUN: 16 mg/dL (ref 8–27)
Bilirubin Total: 0.3 mg/dL (ref 0.0–1.2)
CO2: 18 mmol/L — ABNORMAL LOW (ref 20–29)
Calcium: 9.3 mg/dL (ref 8.6–10.2)
Chloride: 106 mmol/L (ref 96–106)
Creatinine, Ser: 1 mg/dL (ref 0.76–1.27)
Globulin, Total: 2.7 g/dL (ref 1.5–4.5)
Glucose: 112 mg/dL — ABNORMAL HIGH (ref 70–99)
Potassium: 4.4 mmol/L (ref 3.5–5.2)
Sodium: 138 mmol/L (ref 134–144)
Total Protein: 6.8 g/dL (ref 6.0–8.5)
eGFR: 85 mL/min/1.73 (ref >59)

## 2023-08-30 LAB — CBC WITH DIFFERENTIAL/PLATELET
Basophils Absolute: 0.1 x10E3/uL (ref 0.0–0.2)
Basos: 2 %
EOS (ABSOLUTE): 0.2 x10E3/uL (ref 0.0–0.4)
Eos: 4 %
Hematocrit: 48.7 % (ref 37.5–51.0)
Hemoglobin: 16.2 g/dL (ref 13.0–17.7)
Immature Grans (Abs): 0 x10E3/uL (ref 0.0–0.1)
Immature Granulocytes: 0 %
Lymphocytes Absolute: 2 x10E3/uL (ref 0.7–3.1)
Lymphs: 39 %
MCH: 29.3 pg (ref 26.6–33.0)
MCHC: 33.3 g/dL (ref 31.5–35.7)
MCV: 88 fL (ref 79–97)
Monocytes Absolute: 0.3 x10E3/uL (ref 0.1–0.9)
Monocytes: 7 %
Neutrophils Absolute: 2.6 x10E3/uL (ref 1.4–7.0)
Neutrophils: 48 %
Platelets: 253 x10E3/uL (ref 150–450)
RBC: 5.53 x10E6/uL (ref 4.14–5.80)
RDW: 13.3 % (ref 11.6–15.4)
WBC: 5.2 x10E3/uL (ref 3.4–10.8)

## 2023-08-30 LAB — HEMOGLOBIN A1C
Est. average glucose Bld gHb Est-mCnc: 134 mg/dL
Hgb A1c MFr Bld: 6.3 % — ABNORMAL HIGH (ref 4.8–5.6)

## 2023-08-30 LAB — TESTOSTERONE,FREE AND TOTAL
Testosterone, Free: 6.6 pg/mL (ref 6.6–18.1)
Testosterone: 318 ng/dL (ref 264–916)

## 2023-08-30 LAB — PSA, TOTAL AND FREE
PSA, Free Pct: 22.3 %
PSA, Free: 1.25 ng/mL
Prostate Specific Ag, Serum: 5.6 ng/mL — ABNORMAL HIGH (ref 0.0–4.0)

## 2023-08-30 LAB — TSH: TSH: 1.64 u[IU]/mL (ref 0.450–4.500)

## 2023-08-30 LAB — VITAMIN D 25 HYDROXY (VIT D DEFICIENCY, FRACTURES): Vit D, 25-Hydroxy: 13.9 ng/mL — ABNORMAL LOW (ref 30.0–100.0)

## 2023-08-30 LAB — T4, FREE: Free T4: 1.03 ng/dL (ref 0.82–1.77)

## 2023-08-30 MED ORDER — ERGOCALCIFEROL 1.25 MG (50000 UT) PO CAPS
50000.0000 [IU] | ORAL_CAPSULE | ORAL | 1 refills | Status: DC
  Filled 2023-08-30 – 2023-09-06 (×2): qty 4, 28d supply, fill #0

## 2023-08-30 MED ORDER — METFORMIN HCL ER 500 MG PO TB24
500.0000 mg | ORAL_TABLET | Freq: Every day | ORAL | 1 refills | Status: AC
  Filled 2023-08-30 – 2023-09-06 (×2): qty 30, 30d supply, fill #0
  Filled 2023-10-24 (×2): qty 30, 30d supply, fill #1
  Filled 2023-11-24 (×2): qty 30, 30d supply, fill #2
  Filled 2023-12-27: qty 30, 30d supply, fill #3
  Filled 2023-12-27: qty 30, 30d supply, fill #0
  Filled 2024-01-28 (×2): qty 30, 30d supply, fill #1
  Filled 2024-03-07: qty 30, 30d supply, fill #2

## 2023-09-06 ENCOUNTER — Other Ambulatory Visit (HOSPITAL_COMMUNITY): Payer: Self-pay

## 2023-09-06 ENCOUNTER — Other Ambulatory Visit: Payer: Self-pay

## 2023-09-07 ENCOUNTER — Other Ambulatory Visit (HOSPITAL_COMMUNITY): Payer: Self-pay

## 2023-09-07 NOTE — Telephone Encounter (Signed)
 Routing as [employer]

## 2023-09-07 NOTE — Telephone Encounter (Signed)
 Copied from CRM [id number]. Topic: Clinical - Request for Lab/Test Order >> Sep 07, 2023  9:15 AM Turkey B wrote:  Reason for CRM: patient called in , I gave him lab results

## 2023-09-08 ENCOUNTER — Telehealth: Payer: Self-pay | Admitting: Family Medicine

## 2023-09-08 NOTE — Telephone Encounter (Signed)
 Copied from CRM #[id number]. Topic: Clinical - Medication Question >> Sep 08, 2023 11:22 AM DeAngela L wrote:  Reason for CRM: patient has questions about why he has a new medication sent to his pharm and what is this medication used for and why does he have this prescription?  metFORMIN  (GLUCOPHAGE -XR) 500 MG 24 hr tablet Pt num [phone] (M) ok to leave a detailed message

## 2023-09-08 NOTE — Telephone Encounter (Signed)
 LVM informing patient that Metformin  was sent to pharmacy due to his recent A1C being 6.2 which is prediabetes range.

## 2023-09-20 ENCOUNTER — Ambulatory Visit: Admitting: Family Medicine

## 2023-09-27 ENCOUNTER — Other Ambulatory Visit (HOSPITAL_COMMUNITY): Payer: Self-pay

## 2023-09-27 MED ORDER — PREDNISOLONE ACETATE 1 % OP SUSP
OPHTHALMIC | 0 refills | Status: AC
  Filled 2023-09-27: qty 5, 28d supply, fill #0

## 2023-09-27 MED ORDER — OFLOXACIN 0.3 % OP SOLN
1.0000 [drp] | Freq: Four times a day (QID) | OPHTHALMIC | 0 refills | Status: AC
  Filled 2023-09-27: qty 5, 7d supply, fill #0

## 2023-09-30 ENCOUNTER — Other Ambulatory Visit (HOSPITAL_COMMUNITY): Payer: Self-pay

## 2023-10-04 ENCOUNTER — Other Ambulatory Visit: Payer: Self-pay

## 2023-10-04 ENCOUNTER — Other Ambulatory Visit (HOSPITAL_COMMUNITY): Payer: Self-pay

## 2023-10-24 ENCOUNTER — Other Ambulatory Visit (HOSPITAL_COMMUNITY): Payer: Self-pay

## 2023-11-05 ENCOUNTER — Other Ambulatory Visit (HOSPITAL_COMMUNITY): Payer: Self-pay

## 2023-11-24 ENCOUNTER — Other Ambulatory Visit (HOSPITAL_COMMUNITY): Payer: Self-pay

## 2023-11-25 ENCOUNTER — Other Ambulatory Visit: Payer: Self-pay

## 2023-12-07 ENCOUNTER — Other Ambulatory Visit (HOSPITAL_COMMUNITY): Payer: Self-pay

## 2023-12-27 ENCOUNTER — Other Ambulatory Visit (HOSPITAL_COMMUNITY): Payer: Self-pay

## 2023-12-28 ENCOUNTER — Other Ambulatory Visit (HOSPITAL_COMMUNITY): Payer: Self-pay

## 2023-12-29 ENCOUNTER — Other Ambulatory Visit (HOSPITAL_COMMUNITY): Payer: Self-pay

## 2024-01-04 ENCOUNTER — Other Ambulatory Visit (HOSPITAL_COMMUNITY): Payer: Self-pay

## 2024-01-31 ENCOUNTER — Other Ambulatory Visit (HOSPITAL_COMMUNITY): Payer: Self-pay

## 2024-02-14 ENCOUNTER — Other Ambulatory Visit (HOSPITAL_COMMUNITY): Payer: Self-pay

## 2024-02-15 ENCOUNTER — Other Ambulatory Visit (HOSPITAL_COMMUNITY): Payer: Self-pay

## 2024-02-15 ENCOUNTER — Other Ambulatory Visit: Payer: Self-pay

## 2024-03-06 ENCOUNTER — Telehealth: Payer: Self-pay | Admitting: Family Medicine

## 2024-03-06 NOTE — Telephone Encounter (Signed)
 Pt confirmed appt

## 2024-03-07 ENCOUNTER — Ambulatory Visit: Attending: Family Medicine | Admitting: Family Medicine

## 2024-03-07 ENCOUNTER — Other Ambulatory Visit: Payer: Self-pay

## 2024-03-07 ENCOUNTER — Encounter: Payer: Self-pay | Admitting: Family Medicine

## 2024-03-07 ENCOUNTER — Other Ambulatory Visit (HOSPITAL_COMMUNITY): Payer: Self-pay

## 2024-03-07 VITALS — BP 160/79 | HR 76 | Temp 97.8°F | Ht 74.0 in | Wt 206.4 lb

## 2024-03-07 DIAGNOSIS — Z7984 Long term (current) use of oral hypoglycemic drugs: Secondary | ICD-10-CM | POA: Diagnosis not present

## 2024-03-07 DIAGNOSIS — R972 Elevated prostate specific antigen [PSA]: Secondary | ICD-10-CM

## 2024-03-07 DIAGNOSIS — M799 Soft tissue disorder, unspecified: Secondary | ICD-10-CM

## 2024-03-07 DIAGNOSIS — N401 Enlarged prostate with lower urinary tract symptoms: Secondary | ICD-10-CM | POA: Diagnosis not present

## 2024-03-07 DIAGNOSIS — E7800 Pure hypercholesterolemia, unspecified: Secondary | ICD-10-CM | POA: Diagnosis not present

## 2024-03-07 DIAGNOSIS — E1140 Type 2 diabetes mellitus with diabetic neuropathy, unspecified: Secondary | ICD-10-CM | POA: Diagnosis not present

## 2024-03-07 DIAGNOSIS — E559 Vitamin D deficiency, unspecified: Secondary | ICD-10-CM | POA: Diagnosis not present

## 2024-03-07 DIAGNOSIS — E119 Type 2 diabetes mellitus without complications: Secondary | ICD-10-CM | POA: Insufficient documentation

## 2024-03-07 DIAGNOSIS — I10 Essential (primary) hypertension: Secondary | ICD-10-CM | POA: Diagnosis not present

## 2024-03-07 LAB — POCT GLYCOSYLATED HEMOGLOBIN (HGB A1C): HbA1c, POC (controlled diabetic range): 6.5 % (ref 0.0–7.0)

## 2024-03-07 MED ORDER — AMLODIPINE BESYLATE 5 MG PO TABS
5.0000 mg | ORAL_TABLET | Freq: Every day | ORAL | 1 refills | Status: AC
  Filled 2024-03-07: qty 30, 30d supply, fill #0

## 2024-03-07 MED ORDER — METFORMIN HCL ER 500 MG PO TB24
500.0000 mg | ORAL_TABLET | Freq: Every day | ORAL | 1 refills | Status: AC
  Filled 2024-03-07: qty 90, 90d supply, fill #0

## 2024-03-07 NOTE — Patient Instructions (Signed)
 Please call to schedule your Urology appointment: Sent Referral to [facility] ph. # [phone].Address [address] [place] [address].     Sent Referral and Notes in [facility] [facility] PRO[facility]

## 2024-03-07 NOTE — Progress Notes (Signed)
 "  Subjective:  Patient ID: Zachary Hester, male    DOB: [birth year]  Age: 64 y.o. MRN: [id number]  CC: Medical Management of Chronic Issues     Discussed the use of AI scribe software for clinical note transcription with the patient, who gave verbal consent to proceed.  History of Present Illness Zachary Hester is a 64 year old male with a history of low back pain from spinal stenosis, hyperlipidemia, BPH, new diagnosis of type 2 diabetes mellitus who presents for follow-up of his blood pressure and diabetes management.  His blood pressure today is 156/72, up from 145/70 at his last visit. He takes amlodipine  2.5 mg at night for hypertension and missed last night's dose after taking the medication to work and forgetting to take it.  He has prediabetes with HbA1c increased from 6.3 to 6.5. He takes metformin  500 mg daily and missed his dose last night. He watches starch and sweets in his diet.  He takes atorvastatin  for cholesterol and his last cholesterol check was normal. He has not eaten today for fasting blood tests.  He stopped his BPH medication (Flomax ) because of markedly reduced ejaculate volume during intercourse. He previously had an elevated PSA and has not yet seen a urologist even though referral was placed.  He has a slowly enlarging growth on his left knee for 2 to 3 years. It is not painful or itchy. He has intermittent numbness in his left leg, especially when sitting in his car, which is bothersome and sometimes relieved by hitting the area. He has had low vitamin D  levels in the past.    Past Medical History:  Diagnosis Date   Back pain    Chronic back pain    HTN (hypertension)    Hypercholesteremia     Past Surgical History:  Procedure Laterality Date   NO PAST SURGERIES      Family History  Problem Relation Age of Onset   Hypertension Mother    Heart disease Sister    Diabetes Sister     Social History   Socioeconomic History   Marital  status: Married    Spouse name: Not on file   Number of children: 6   Years of education: Not on file   Highest education level: Not on file  Occupational History   Occupation: [employer]  Tobacco Use   Smoking status: Never   Smokeless tobacco: Never  Vaping Use   Vaping status: Never Used  Substance and Sexual Activity   Alcohol  use: No   Drug use: No   Sexual activity: Yes  Other Topics Concern   Not on file  Social History Narrative   Not on file   Social Drivers of Health   Tobacco Use: Low Risk (03/07/2024)   Patient History    Smoking Tobacco Use: Never    Smokeless Tobacco Use: Never    Passive Exposure: Not on file  Financial Resource Strain: Medium Risk (08/29/2023)   Overall Financial Resource Strain (CARDIA)    Difficulty of Paying Living Expenses: Somewhat hard  Food Insecurity: Food Insecurity Present (08/29/2023)   Epic    Worried About Programme Researcher, Broadcasting/film/video in the Last Year: Sometimes true    Ran Out of Food in the Last Year: Sometimes true  Transportation Needs: No Transportation Needs (08/29/2023)   Epic    Lack of Transportation (Medical): No    Lack of Transportation (Non-Medical): No  Physical Activity: Inactive (08/29/2023)   Exercise Vital Sign  Days of Exercise per Week: 0 days    Minutes of Exercise per Session: 0 min  Stress: Stress Concern Present (08/29/2023)   [employer] of Occupational Health - Occupational Stress Questionnaire    Feeling of Stress: To some extent  Social Connections: Socially Integrated (08/29/2023)   Social Connection and Isolation Panel    Frequency of Communication with Friends and Family: More than three times a week    Frequency of Social Gatherings with Friends and Family: More than three times a week    Attends Religious Services: 1 to 4 times per year    Active Member of Clubs or Organizations: No    Attends Engineer, Structural: More than 4 times per year    Marital Status: Married  Depression  (PHQ2-9): Low Risk (03/07/2024)   Depression (PHQ2-9)    PHQ-2 Score: 2  Alcohol  Screen: Low Risk (08/29/2023)   Alcohol  Screen    Last Alcohol  Screening Score (AUDIT): 0  Housing: Unknown (08/29/2023)   Epic    Unable to Pay for Housing in the Last Year: No    Number of Times Moved in the Last Year: Not on file    Homeless in the Last Year: No  Utilities: At Risk (08/29/2023)   Epic    Threatened with loss of utilities: Yes  Health Literacy: Adequate Health Literacy (08/29/2023)   B1300 Health Literacy    Frequency of need for help with medical instructions: Never    Allergies[1]  Outpatient Medications Prior to Visit  Medication Sig Dispense Refill   atorvastatin  (LIPITOR) 40 MG tablet Take 1 tablet (40 mg total) by mouth daily. 90 tablet 1   ibuprofen  (ADVIL ) 600 MG tablet Take 1 tablet (600 mg total) by mouth every 6 (six) hours as needed. 30 tablet 0   metFORMIN  (GLUCOPHAGE -XR) 500 MG 24 hr tablet Take 1 tablet (500 mg total) by mouth daily with breakfast. 90 tablet 1   amLODipine  (NORVASC ) 2.5 MG tablet Take 1 tablet (2.5 mg total) by mouth daily. 90 tablet 1   ergocalciferol  (DRISDOL ) 1.25 MG (50000 UT) capsule Take 1 capsule (50,000 Units total) by mouth once a week. (Patient not taking: Reported on 03/07/2024) 12 capsule 1   tamsulosin  (FLOMAX ) 0.4 MG CAPS capsule Take 1 capsule (0.4 mg total) by mouth daily. (Patient not taking: Reported on 03/07/2024) 90 capsule 1   No facility-administered medications prior to visit.     ROS Review of Systems  Constitutional:  Negative for activity change and appetite change.  HENT:  Negative for sinus pressure and sore throat.   Respiratory:  Negative for chest tightness, shortness of breath and wheezing.   Cardiovascular:  Negative for chest pain and palpitations.  Gastrointestinal:  Negative for abdominal distention, abdominal pain and constipation.  Genitourinary: Negative.   Musculoskeletal: Negative.   Neurological:  Positive for  numbness.  Psychiatric/Behavioral:  Negative for behavioral problems and dysphoric mood.     Objective:  BP (!) 160/79   Pulse 76   Temp 97.8 F (36.6 C) (Oral)   Ht 6' 2 (1.88 m)   Wt 206 lb 6.4 oz (93.6 kg)   SpO2 99%   BMI 26.50 kg/m      03/07/2024    3:16 PM 03/07/2024    2:24 PM 08/29/2023    9:34 AM  BP/Weight  Systolic BP 160 156 145  Diastolic BP 79 72 70  Wt. (Lbs)  206.4   BMI  26.5 kg/m2  Physical Exam Constitutional:      Appearance: He is well-developed.  Cardiovascular:     Rate and Rhythm: Normal rate.     Heart sounds: Normal heart sounds. No murmur heard. Pulmonary:     Effort: Pulmonary effort is normal.     Breath sounds: Normal breath sounds. No wheezing or rales.  Chest:     Chest wall: No tenderness.  Abdominal:     General: Bowel sounds are normal. There is no distension.     Palpations: Abdomen is soft. There is no mass.     Tenderness: There is no abdominal tenderness.  Musculoskeletal:        General: Normal range of motion.     Right lower leg: No edema.     Left lower leg: No edema.  Skin:    Comments: See image below  Neurological:     Mental Status: He is alert and oriented to person, place, and time.  Psychiatric:        Mood and Affect: Mood normal.        Diabetic Foot Exam - Simple   Simple Foot Form Diabetic Foot exam was performed with the following findings: Yes 03/07/2024  3:19 PM  Visual Inspection No deformities, no ulcerations, no other skin breakdown bilaterally: Yes Sensation Testing Pulse Check Posterior Tibialis and Dorsalis pulse intact bilaterally: Yes Comments Decreased sensation in sole of left foot, normal sensation in sole of right foot        Latest Ref Rng & Units 08/29/2023    9:44 AM 01/05/2023    4:52 PM 09/21/2021    3:52 PM  CMP  Glucose 70 - 99 mg/dL 887  82  91   BUN 8 - 27 mg/dL 16  11  14    Creatinine 0.76 - 1.27 mg/dL 8.99  9.24  9.04   Sodium 134 - 144 mmol/L 138  138   140   Potassium 3.5 - 5.2 mmol/L 4.4  4.2  3.9   Chloride 96 - 106 mmol/L 106  103  105   CO2 20 - 29 mmol/L 18  20  20    Calcium  8.6 - 10.2 mg/dL 9.3  9.5  9.7   Total Protein 6.0 - 8.5 g/dL 6.8  6.7  7.0   Total Bilirubin 0.0 - 1.2 mg/dL 0.3  0.8  0.5   Alkaline Phos 44 - 121 IU/L 116  118  114   AST 0 - 40 IU/L 16  19  17    ALT 0 - 44 IU/L 16  18  13      Lipid Panel     Component Value Date/Time   CHOL 156 01/05/2023 1652   TRIG 97 01/05/2023 1652   HDL 48 01/05/2023 1652   CHOLHDL 4.8 08/14/2020 1452   CHOLHDL 5.3 Ratio 11/05/2009 2215   VLDL 30 11/05/2009 2215   LDLCALC 90 01/05/2023 1652    CBC    Component Value Date/Time   WBC 5.2 08/29/2023 0944   WBC 6.2 09/01/2020 1159   RBC 5.53 08/29/2023 0944   RBC 5.35 09/01/2020 1200   RBC 5.46 09/01/2020 1159   HGB 16.2 08/29/2023 0944   HCT 48.7 08/29/2023 0944   PLT 253 08/29/2023 0944   MCV 88 08/29/2023 0944   MCH 29.3 08/29/2023 0944   MCH 29.3 09/01/2020 1159   MCHC 33.3 08/29/2023 0944   MCHC 34.7 09/01/2020 1159   RDW 13.3 08/29/2023 0944   LYMPHSABS 2.0 08/29/2023 0944   MONOABS 0.4  09/01/2020 1159   EOSABS 0.2 08/29/2023 0944   BASOSABS 0.1 08/29/2023 0944    Lab Results  Component Value Date   HGBA1C 6.5 03/07/2024    Lab Results  Component Value Date   HGBA1C 6.5 03/07/2024   HGBA1C 6.3 (H) 08/29/2023   HGBA1C 5.9 01/05/2023       Assessment & Plan Primary hypertension Blood pressure elevated at 156/72 mmHg, likely due to missed amlodipine  dose. Previous readings indicate suboptimal control. - Increased amlodipine  to 5 mg daily. -Counseled on blood pressure goal of less than 130/80, low-sodium, DASH diet, medication compliance, 150 minutes of moderate intensity exercise per week. Discussed medication compliance, adverse effects. - Reassess blood pressure in 3 months.  Type 2 diabetes mellitus with diabetic neuropathy Hemoglobin A1c increased from 6.3% to 6.5%. Reports numbness in left  foot, suggestive of diabetic neuropathy. - Continue metformin  500 mg once daily. - Monitor blood glucose levels daily. - Ordered vitamin B12 and vitamin D  levels. - Prescribed gabapentin  at night for neuropathy. -Counseled on Diabetic diet, the healthy plate, 849 minutes of moderate intensity exercise/week Blood sugar logs with fasting goals of 80-120 mg/dl, random of less than 819 and in the event of sugars less than 60 mg/dl or greater than 599 mg/dl encouraged to notify the clinic. Advised on the need for annual eye exams, annual foot exams, Pneumonia vaccine.   Elevated PSA PSA elevated in July. No urologist follow-up yet. - Ordered repeat PSA test. - Provided contact information for urologist for follow-up appointment.  Soft tissue lesion of left knee Soft tissue lesion on left knee for 2-3 years. No pain or itching. - Referred to dermatologist for evaluation and possible excision and biopsy  Vitamin D  deficiency Previous vitamin D  levels were low. Current status to be reassessed. - Ordered vitamin D  level.  Hyperlipidemia Currently on atorvastatin .  BPH - Discontinued Flomax  due to low ejaculate - He would rather not be on Flomax  hence we will discontinue this.   Meds ordered this encounter  Medications   amLODipine  (NORVASC ) 5 MG tablet    Sig: Take 1 tablet (5 mg total) by mouth daily.    Dispense:  90 tablet    Refill:  1    Dose increase   metFORMIN  (GLUCOPHAGE -XR) 500 MG 24 hr tablet    Sig: Take 1 tablet (500 mg total) by mouth daily with breakfast.    Dispense:  90 tablet    Refill:  1    Follow-up: Return in about 3 months (around 06/05/2024) for Chronic medical conditions.       Corrina Sabin, MD, FAAFP. [facility] [place], [place] [phone]   03/07/2024, 5:10 PM     [1] No Known Allergies  "

## 2024-03-08 ENCOUNTER — Other Ambulatory Visit (HOSPITAL_COMMUNITY): Payer: Self-pay

## 2024-03-08 ENCOUNTER — Ambulatory Visit: Payer: Self-pay | Admitting: Family Medicine

## 2024-03-08 LAB — LP+NON-HDL CHOLESTEROL
Cholesterol, Total: 164 mg/dL (ref 100–199)
HDL: 47 mg/dL
LDL Chol Calc (NIH): 95 mg/dL (ref 0–99)
Total Non-HDL-Chol (LDL+VLDL): 117 mg/dL (ref 0–129)
Triglycerides: 120 mg/dL (ref 0–149)
VLDL Cholesterol Cal: 22 mg/dL (ref 5–40)

## 2024-03-08 LAB — CMP14+EGFR
ALT: 27 [IU]/L (ref 0–44)
AST: 24 [IU]/L (ref 0–40)
Albumin: 4.4 g/dL (ref 3.9–4.9)
Alkaline Phosphatase: 122 [IU]/L (ref 47–123)
BUN/Creatinine Ratio: 11 (ref 10–24)
BUN: 11 mg/dL (ref 8–27)
Bilirubin Total: 0.7 mg/dL (ref 0.0–1.2)
CO2: 18 mmol/L — ABNORMAL LOW (ref 20–29)
Calcium: 9.6 mg/dL (ref 8.6–10.2)
Chloride: 105 mmol/L (ref 96–106)
Creatinine, Ser: 0.98 mg/dL (ref 0.76–1.27)
Globulin, Total: 2.7 g/dL (ref 1.5–4.5)
Glucose: 88 mg/dL (ref 70–99)
Potassium: 4.4 mmol/L (ref 3.5–5.2)
Sodium: 142 mmol/L (ref 134–144)
Total Protein: 7.1 g/dL (ref 6.0–8.5)
eGFR: 86 mL/min/{1.73_m2}

## 2024-03-08 LAB — MICROALBUMIN / CREATININE URINE RATIO
Creatinine, Urine: 204.2 mg/dL
Microalb/Creat Ratio: 3 mg/g{creat} (ref 0–29)
Microalbumin, Urine: 6.6 ug/mL

## 2024-03-08 LAB — PSA, TOTAL AND FREE
PSA, Free Pct: 17.8 %
PSA, Free: 1.14 ng/mL
Prostate Specific Ag, Serum: 6.4 ng/mL — ABNORMAL HIGH (ref 0.0–4.0)

## 2024-03-08 LAB — VITAMIN D 25 HYDROXY (VIT D DEFICIENCY, FRACTURES): Vit D, 25-Hydroxy: 18.9 ng/mL — ABNORMAL LOW (ref 30.0–100.0)

## 2024-03-08 LAB — VITAMIN B12: Vitamin B-12: 805 pg/mL (ref 232–1245)

## 2024-03-08 MED ORDER — ERGOCALCIFEROL 1.25 MG (50000 UT) PO CAPS
50000.0000 [IU] | ORAL_CAPSULE | ORAL | 1 refills | Status: AC
  Filled 2024-03-08: qty 4, 28d supply, fill #0

## 2024-06-05 ENCOUNTER — Ambulatory Visit: Payer: Self-pay | Admitting: Family Medicine
